# Patient Record
Sex: Male | Born: 1965 | Race: White | Hispanic: No | Marital: Married | Smoking: Never smoker
Health system: Southern US, Community
[De-identification: ages and names within clinical notes are randomized; demographics above are authoritative.]

## PROBLEM LIST (undated history)

## (undated) DIAGNOSIS — Z1379 Encounter for other screening for genetic and chromosomal anomalies: Principal | ICD-10-CM

## (undated) DIAGNOSIS — Z87442 Personal history of urinary calculi: Secondary | ICD-10-CM

## (undated) DIAGNOSIS — C642 Malignant neoplasm of left kidney, except renal pelvis: Secondary | ICD-10-CM

## (undated) DIAGNOSIS — N2889 Other specified disorders of kidney and ureter: Secondary | ICD-10-CM

## (undated) HISTORY — DX: Malignant neoplasm of left kidney, except renal pelvis: C64.2

## (undated) HISTORY — PX: NO PAST SURGERIES: SHX2092

## (undated) HISTORY — PX: OTHER SURGICAL HISTORY: SHX169

## (undated) HISTORY — DX: Encounter for other screening for genetic and chromosomal anomalies: Z13.79

---

## 2014-04-01 ENCOUNTER — Emergency Department (HOSPITAL_BASED_OUTPATIENT_CLINIC_OR_DEPARTMENT_OTHER): Payer: BC Managed Care – PPO

## 2014-04-01 ENCOUNTER — Encounter (HOSPITAL_BASED_OUTPATIENT_CLINIC_OR_DEPARTMENT_OTHER): Payer: Self-pay | Admitting: Emergency Medicine

## 2014-04-01 ENCOUNTER — Emergency Department (HOSPITAL_BASED_OUTPATIENT_CLINIC_OR_DEPARTMENT_OTHER)
Admission: EM | Admit: 2014-04-01 | Discharge: 2014-04-01 | Disposition: A | Discharge status: Home / Self Care | Payer: BC Managed Care – PPO | Attending: Emergency Medicine | Admitting: Emergency Medicine

## 2014-04-01 DIAGNOSIS — M79609 Pain in unspecified limb: Secondary | ICD-10-CM | POA: Insufficient documentation

## 2014-04-01 DIAGNOSIS — M79662 Pain in left lower leg: Secondary | ICD-10-CM

## 2014-04-01 DIAGNOSIS — M7989 Other specified soft tissue disorders: Secondary | ICD-10-CM | POA: Insufficient documentation

## 2014-04-01 LAB — PROTIME-INR
INR: 1.06 (ref 0.00–1.49)
PROTHROMBIN TIME: 13.8 s (ref 11.6–15.2)

## 2014-04-01 LAB — CBC
HCT: 41.7 % (ref 39.0–52.0)
Hemoglobin: 13.8 g/dL (ref 13.0–17.0)
MCH: 28.9 pg (ref 26.0–34.0)
MCHC: 33.1 g/dL (ref 30.0–36.0)
MCV: 87.4 fL (ref 78.0–100.0)
PLATELETS: 211 10*3/uL (ref 150–400)
RBC: 4.77 MIL/uL (ref 4.22–5.81)
RDW: 13.3 % (ref 11.5–15.5)
WBC: 7.2 10*3/uL (ref 4.0–10.5)

## 2014-04-01 LAB — D-DIMER, QUANTITATIVE (NOT AT ARMC): D DIMER QUANT: 0.72 ug{FEU}/mL — AB (ref 0.00–0.48)

## 2014-04-01 MED ORDER — NAPROXEN 500 MG PO TABS: 500.0000 mg | ORAL_TABLET | Freq: Two times a day (BID) | ORAL | Status: DC

## 2014-04-01 NOTE — ED Provider Notes (Signed)
Medical screening examination/treatment/procedure(s) were conducted as a shared visit with non-physician practitioner(s) and myself.  I personally evaluated the patient during the encounter.   Patient seen by me. Patient with complaint of left leg swelling no significant ankle swelling left leg is definitely bigger clinically than the right. Patient feels as if he has a pulled muscle in that area. No history of distinct injury. However [place] a lot of bike riding. No problem with the right leg. Patient has no shortness of breath no chest pain. D-dimer was elevated so Doppler study of the leg was done it is negative no evidence of any DVT. We'll treat for an inflammatory musculoskeletal type swelling of Naprosyn. Patient will return for any newer worse symptoms.   EKG Interpretation None     .   EKG Interpretation None      Results for orders placed during the hospital encounter of 04/01/14  PROTIME-INR      Result Value Ref Range   Prothrombin Time 13.8  11.6 - 15.2 seconds   INR 1.06  0.00 - 1.49  CBC      Result Value Ref Range   WBC 7.2  4.0 - 10.5 K/uL   RBC 4.77  4.22 - 5.81 MIL/uL   Hemoglobin 13.8  13.0 - 17.0 g/dL   HCT 41.7  39.0 - 52.0 %   MCV 87.4  78.0 - 100.0 fL   MCH 28.9  26.0 - 34.0 pg   MCHC 33.1  30.0 - 36.0 g/dL   RDW 13.3  11.5 - 15.5 %   Platelets 211  150 - 400 K/uL  D-DIMER, QUANTITATIVE      Result Value Ref Range   D-Dimer, Quant 0.72 (*) 0.00 - 0.48 ug/mL-FEU   Results for orders placed during the hospital encounter of 04/01/14  PROTIME-INR      Result Value Ref Range   Prothrombin Time 13.8  11.6 - 15.2 seconds   INR 1.06  0.00 - 1.49  CBC      Result Value Ref Range   WBC 7.2  4.0 - 10.5 K/uL   RBC 4.77  4.22 - 5.81 MIL/uL   Hemoglobin 13.8  13.0 - 17.0 g/dL   HCT 41.7  39.0 - 52.0 %   MCV 87.4  78.0 - 100.0 fL   MCH 28.9  26.0 - 34.0 pg   MCHC 33.1  30.0 - 36.0 g/dL   RDW 13.3  11.5 - 15.5 %    Platelets 211  150 - 400 K/uL  D-DIMER, QUANTITATIVE      Result Value Ref Range   D-Dimer, Quant 0.72 (*) 0.00 - 0.48 ug/mL-FEU   US Venous Img Lower Unilateral Left  04/01/2014   CLINICAL DATA:  Pain and swelling in the left calf. History of varicose veins.  EXAM: Left LOWER EXTREMITY VENOUS DOPPLER ULTRASOUND  TECHNIQUE: Gray-scale sonography with graded compression, as well as color Doppler and duplex ultrasound were performed to evaluate the lower extremity deep venous systems from the level of the common femoral vein and including the common femoral, femoral, profunda femoral, popliteal and calf veins including the posterior tibial, peroneal and gastrocnemius veins when visible. The superficial great saphenous vein was also interrogated. Spectral Doppler was utilized to evaluate flow at rest and with distal augmentation maneuvers in the common femoral, femoral and popliteal veins.  COMPARISON:  None.  FINDINGS: Common Femoral Vein: No evidence of thrombus. Normal compressibility, respiratory phasicity  and response to augmentation.  Saphenofemoral Junction: No evidence of thrombus. Normal compressibility and flow on color Doppler imaging.  Profunda Femoral Vein: No evidence of thrombus. Normal compressibility and flow on color Doppler imaging.  Femoral Vein: No evidence of thrombus. Normal compressibility, respiratory phasicity and response to augmentation.  Popliteal Vein: No evidence of thrombus. Normal compressibility, respiratory phasicity and response to augmentation.  Calf Veins: No evidence of thrombus. Normal compressibility and flow on color Doppler imaging.  Superficial Great Saphenous Vein: No evidence of thrombus. Normal compressibility and flow on color Doppler imaging.  Venous Reflux:  None.  Other Findings: Subcutaneous edema demonstrated in the medial left ankle.  IMPRESSION: No evidence of deep venous thrombosis.   Electronically Signed   By: Lucienne Capers M.D.   On: 04/01/2014  22:33           Fredia Sorrow, MD 04/01/14 [phone]

## 2014-04-01 NOTE — ED Notes (Signed)
Pt. Reports he has been on a vacation driving in a car multiple times.

## 2014-04-01 NOTE — Discharge Instructions (Signed)
Please follow the instructions provided.  You may take the anti-inflammatory medicine as prescribed.  Please follow-up with your primary care provider to continue to follow this swelling.    Return to the nearest ED immediately if you develop chest pain, shortness of breath, dizziness or fainting, new color change or weakness/numbness to your affected leg(s), bleeding, severe headache, confusion or altered mental status, or other concerns.

## 2014-04-01 NOTE — ED Notes (Signed)
Pt. Reports he started having the feeling of a pulled muscle in the Lower L leg and today he noticed he has edema in the L calf.  Pt. Reports pressure pain with walking but other wise no pain.

## 2014-04-01 NOTE — ED Provider Notes (Signed)
CSN: [id number]     Arrival date & time 04/01/14  1929 History   First MD Initiated Contact with Patient 04/01/14 2028     Chief Complaint  Patient presents with  . Leg Pain   HPI Devon Johnson is a 49 yo male with no PMH, presnts to ED with c/o left lower leg swelling x 2 days.  Pt reports he had taken several long drives (5-6 hours each way) 4 days ago.  Then two days ago, he noticed his left calf was larger than the right.  He reports pain only on palpation, describes as achy and rates as 2/10.  He did take an aspirin when when it initially occurred with no change.  He does think it may look less swollen today than yesterday, but not significantly.  He denies redness, heat or pain at rest.  He denies fever, chills, altered sensation, chest pain, or shortness of breath.    History reviewed. No pertinent past medical history. History reviewed. No pertinent past surgical history. No family history on file. History  Substance Use Topics  . Smoking status: Never Smoker   . Smokeless tobacco: Not on file  . Alcohol Use: Not on file    Review of Systems  Constitutional: Negative for chills.  HENT: Negative for sore throat.   Eyes: Negative for visual disturbance.  Respiratory: Negative for cough and shortness of breath.   Cardiovascular: Negative for chest pain and leg swelling.  Gastrointestinal: Negative for nausea, vomiting and diarrhea.  Genitourinary: Negative for dysuria.  Musculoskeletal: Negative for myalgias.  Skin: Negative for rash.  Neurological: Negative for weakness, numbness and headaches.    Allergies  Review of patient's allergies indicates not on file.  Home Medications    BP 145/93  Pulse 83  Resp 18  Ht 6' (1.829 m)  Wt 280 lb (127.007 kg)  BMI 37.97 kg/m2  SpO2 100% Physical Exam  Nursing note and vitals reviewed. Constitutional: He appears well-developed and well-nourished. No distress.  HENT:  Head: Normocephalic and atraumatic.  Mouth/Throat:  Oropharynx is clear and moist.  Eyes: Conjunctivae are normal. Pupils are equal, round, and reactive to light. No scleral icterus.  Neck: Normal range of motion. Neck supple.  Cardiovascular: Normal rate, regular rhythm and intact distal pulses.   Pulses:      Radial pulses are 2+ on the right side, and 2+ on the left side.       Dorsalis pedis pulses are 2+ on the right side, and 2+ on the left side.       Posterior tibial pulses are 2+ on the right side, and 2+ on the left side.  Pulmonary/Chest: Effort normal and breath sounds normal. Not tachypneic. No respiratory distress. He has no wheezes. He has no rales. He exhibits no tenderness.  Abdominal: Soft. There is no tenderness.  Musculoskeletal:       Left lower leg: He exhibits tenderness and swelling. He exhibits no deformity.  Left calf is obviously larger than rt calf on exam.  No redness, heat or pain.  Lymphadenopathy:    He has no cervical adenopathy.  Neurological: He is alert.  Skin: Skin is warm, dry and intact. No bruising, no ecchymosis and no rash noted. He is not diaphoretic. No erythema.  Psychiatric: He has a normal mood and affect.    ED Course  Procedures (including critical care time) Labs Review Labs Reviewed  D-DIMER, QUANTITATIVE - Abnormal; Notable for the following:    D-Dimer, Quant 0.72 (*)  All other components within normal limits  PROTIME-INR  CBC    Imaging Review US Venous Img Lower Unilateral Left  04/01/2014   CLINICAL DATA:  Pain and swelling in the left calf. History of varicose veins.  EXAM: Left LOWER EXTREMITY VENOUS DOPPLER ULTRASOUND  TECHNIQUE: Gray-scale sonography with graded compression, as well as color Doppler and duplex ultrasound were performed to evaluate the lower extremity deep venous systems from the level of the common femoral vein and including the common femoral, femoral, profunda femoral, popliteal and calf veins including the posterior tibial, peroneal and gastrocnemius  veins when visible. The superficial great saphenous vein was also interrogated. Spectral Doppler was utilized to evaluate flow at rest and with distal augmentation maneuvers in the common femoral, femoral and popliteal veins.  COMPARISON:  None.  FINDINGS: Common Femoral Vein: No evidence of thrombus. Normal compressibility, respiratory phasicity and response to augmentation.  Saphenofemoral Junction: No evidence of thrombus. Normal compressibility and flow on color Doppler imaging.  Profunda Femoral Vein: No evidence of thrombus. Normal compressibility and flow on color Doppler imaging.  Femoral Vein: No evidence of thrombus. Normal compressibility, respiratory phasicity and response to augmentation.  Popliteal Vein: No evidence of thrombus. Normal compressibility, respiratory phasicity and response to augmentation.  Calf Veins: No evidence of thrombus. Normal compressibility and flow on color Doppler imaging.  Superficial Great Saphenous Vein: No evidence of thrombus. Normal compressibility and flow on color Doppler imaging.  Venous Reflux:  None.  Other Findings: Subcutaneous edema demonstrated in the medial left ankle.  IMPRESSION: No evidence of deep venous thrombosis.   Electronically Signed   By: Lucienne Capers M.D.   On: 04/01/2014 22:33     EKG Interpretation None      MDM   Final diagnoses:  Left leg swelling   Pt is 48 yo male presents with left calf swelling x 2 days.  Pt is well appearing, with no chest pain or shortness of breath, 2+ pulses distally.  Ordered Lower extremity US, CBC, PT-INR and D-Dimer.  D-dimer elevated at 0.72.  [place] results: no DVT.   Pt remains pain free in the ED, without c/o chest pain or shortness of breath.  Doubt current acute process including PE.  Discharge instructions include follow-up in PCP and naprosyn.  Pt is agreeable with plan. Return precautions given.    Meds given in ED:  Medications - No data to display  New Prescriptions   NAPROXEN (NAPROSYN)  500 MG TABLET    Take 1 tablet (500 mg total) by mouth 2 (two) times daily.      Britt Bottom, NP 04/01/14 [phone]

## 2017-04-21 ENCOUNTER — Emergency Department (HOSPITAL_BASED_OUTPATIENT_CLINIC_OR_DEPARTMENT_OTHER)
Admission: EM | Admit: 2017-04-21 | Discharge: 2017-04-21 | Disposition: A | Discharge status: Home / Self Care | Payer: BC Managed Care – PPO | Attending: Emergency Medicine | Admitting: Emergency Medicine

## 2017-04-21 ENCOUNTER — Emergency Department (HOSPITAL_BASED_OUTPATIENT_CLINIC_OR_DEPARTMENT_OTHER): Payer: BC Managed Care – PPO

## 2017-04-21 ENCOUNTER — Other Ambulatory Visit (HOSPITAL_BASED_OUTPATIENT_CLINIC_OR_DEPARTMENT_OTHER): Payer: Self-pay | Admitting: Internal Medicine

## 2017-04-21 ENCOUNTER — Encounter (HOSPITAL_BASED_OUTPATIENT_CLINIC_OR_DEPARTMENT_OTHER): Payer: Self-pay | Admitting: Emergency Medicine

## 2017-04-21 DIAGNOSIS — N2889 Other specified disorders of kidney and ureter: Secondary | ICD-10-CM | POA: Diagnosis not present

## 2017-04-21 DIAGNOSIS — R319 Hematuria, unspecified: Secondary | ICD-10-CM | POA: Diagnosis not present

## 2017-04-21 DIAGNOSIS — R109 Unspecified abdominal pain: Secondary | ICD-10-CM

## 2017-04-21 DIAGNOSIS — R1032 Left lower quadrant pain: Secondary | ICD-10-CM | POA: Diagnosis present

## 2017-04-21 DIAGNOSIS — Z79899 Other long term (current) drug therapy: Secondary | ICD-10-CM | POA: Insufficient documentation

## 2017-04-21 LAB — URINALYSIS, ROUTINE W REFLEX MICROSCOPIC
Bilirubin Urine: NEGATIVE
Glucose, UA: 100 mg/dL — AB
Ketones, ur: NEGATIVE mg/dL
NITRITE: POSITIVE — AB
PH: 7.5 (ref 5.0–8.0)
Protein, ur: 30 mg/dL — AB
SPECIFIC GRAVITY, URINE: 1.015 (ref 1.005–1.030)

## 2017-04-21 LAB — CBC WITH DIFFERENTIAL/PLATELET
Basophils Absolute: 0 10*3/uL (ref 0.0–0.1)
Basophils Relative: 0 %
Eosinophils Absolute: 0 10*3/uL (ref 0.0–0.7)
Eosinophils Relative: 0 %
HCT: 44.2 % (ref 39.0–52.0)
HEMOGLOBIN: 15 g/dL (ref 13.0–17.0)
LYMPHS ABS: 1.7 10*3/uL (ref 0.7–4.0)
Lymphocytes Relative: 14 %
MCH: 28.8 pg (ref 26.0–34.0)
MCHC: 33.9 g/dL (ref 30.0–36.0)
MCV: 84.8 fL (ref 78.0–100.0)
Monocytes Absolute: 0.6 10*3/uL (ref 0.1–1.0)
Monocytes Relative: 5 %
NEUTROS PCT: 81 %
Neutro Abs: 9.6 10*3/uL — ABNORMAL HIGH (ref 1.7–7.7)
Platelets: 234 10*3/uL (ref 150–400)
RBC: 5.21 MIL/uL (ref 4.22–5.81)
RDW: 13.3 % (ref 11.5–15.5)
WBC: 12.1 10*3/uL — ABNORMAL HIGH (ref 4.0–10.5)

## 2017-04-21 LAB — URINALYSIS, MICROSCOPIC (REFLEX)
BACTERIA UA: NONE SEEN
Squamous Epithelial / LPF: NONE SEEN

## 2017-04-21 LAB — COMPREHENSIVE METABOLIC PANEL
ALK PHOS: 44 U/L (ref 38–126)
ALT: 19 U/L (ref 17–63)
ANION GAP: 7 (ref 5–15)
AST: 20 U/L (ref 15–41)
Albumin: 4.4 g/dL (ref 3.5–5.0)
BUN: 24 mg/dL — ABNORMAL HIGH (ref 6–20)
CO2: 29 mmol/L (ref 22–32)
CREATININE: 1.35 mg/dL — AB (ref 0.61–1.24)
Calcium: 9.3 mg/dL (ref 8.9–10.3)
Chloride: 104 mmol/L (ref 101–111)
GFR calc Af Amer: >60 mL/min (ref >60)
GFR calc non Af Amer: 59 mL/min — ABNORMAL LOW (ref >60)
Glucose, Bld: 148 mg/dL — ABNORMAL HIGH (ref 65–99)
Potassium: 3.8 mmol/L (ref 3.5–5.1)
Sodium: 140 mmol/L (ref 135–145)
Total Bilirubin: 0.7 mg/dL (ref 0.3–1.2)
Total Protein: 7.3 g/dL (ref 6.5–8.1)

## 2017-04-21 MED ORDER — MORPHINE SULFATE (PF) 4 MG/ML IV SOLN
4.0000 mg | Freq: Once | INTRAVENOUS | Status: AC
  Administered 2017-04-21: 4 mg via INTRAVENOUS
  Filled 2017-04-21: qty 1

## 2017-04-21 MED ORDER — HYDROMORPHONE HCL 1 MG/ML IJ SOLN
1.0000 mg | Freq: Once | INTRAMUSCULAR | Status: AC
  Administered 2017-04-21: 1 mg via INTRAVENOUS
  Filled 2017-04-21: qty 1

## 2017-04-21 MED ORDER — ONDANSETRON HCL 4 MG/2ML IJ SOLN
4.0000 mg | Freq: Once | INTRAMUSCULAR | Status: AC
  Administered 2017-04-21: 4 mg via INTRAVENOUS
  Filled 2017-04-21: qty 2

## 2017-04-21 MED ORDER — ONDANSETRON 8 MG PO TBDP: 8.0000 mg | ORAL_TABLET | Freq: Three times a day (TID) | ORAL | 0 refills | Status: DC | PRN

## 2017-04-21 MED ORDER — SODIUM CHLORIDE 0.9 % IV BOLUS (SEPSIS)
1000.0000 mL | Freq: Once | INTRAVENOUS | Status: AC
Start: 2017-04-21 — End: 2017-04-21
  Administered 2017-04-21: 1000 mL via INTRAVENOUS

## 2017-04-21 MED ORDER — ONDANSETRON HCL 4 MG/2ML IJ SOLN
INTRAMUSCULAR | Status: AC
  Administered 2017-04-21: 4 mg
  Filled 2017-04-21: qty 2

## 2017-04-21 MED ORDER — KETOROLAC TROMETHAMINE 30 MG/ML IJ SOLN
30.0000 mg | Freq: Once | INTRAMUSCULAR | Status: AC
  Administered 2017-04-21: 30 mg via INTRAVENOUS
  Filled 2017-04-21: qty 1

## 2017-04-21 MED ORDER — OXYCODONE-ACETAMINOPHEN 5-325 MG PO TABS: 1.0000 | ORAL_TABLET | Freq: Four times a day (QID) | ORAL | 0 refills | Status: DC | PRN

## 2017-04-21 MED FILL — ONDANSETRON ODT 8 MG TABLET: 8 | 3 days supply | Qty: 10 | Fill #0

## 2017-04-21 MED FILL — OXYCODONE-ACETAMINOPHEN 5-3: 5-325 | 2 days supply | Qty: 20 | Fill #0

## 2017-04-21 NOTE — Discharge Instructions (Signed)
It was our pleasure to provide your ER care today.  As we discussed, today's CT scan shows a 12 cm left-sided kidney mass.   The urologist we spoke to indicates they will contact you with appointment today.  If you have not heard from their office by early this afternoon, contact the office to arrange appointment time.    Take motrin or aleve as need for pain. You may also take percocet as need for pain. No driving for the next 6 hours or when taking percocet. Also, do not take tylenol or acetaminophen containing medication when taking percocet.   You may take zofran as need for nausea.  Return to ER if worse, intractable pain, fevers, persistent vomiting, other concern.

## 2017-04-21 NOTE — ED Provider Notes (Signed)
Signed out to check CT.  CT results discussed w pt.  Pts father had renal cell cancer in his 67's.   Discussed w Dr Karsten Ro - he will have office call pt with appointment.     Lajean Saver, MD 04/21/17 [phone]

## 2017-04-21 NOTE — ED Provider Notes (Signed)
TIME SEEN: 6:44 AM  CHIEF COMPLAINT: Left flank pain, hematuria  HPI: Patient is a 51 year old male with no significant past medical history who presents emergency department with sudden onset left flank pain that woke him from sleep.   He reports he has had some hematuria for the past several days and has been seen by his primary care doctor for this. No dysuria. Tonight he had nausea, vomiting, cold sweats. No fever. He has never had a kidney stone before. No abdominal pain. No chest pain or shortness of breath.   ROS: See HPI Constitutional: no fever  Eyes: no drainage  ENT: no runny nose   Cardiovascular:  no chest pain  Resp: no SOB  GI: no vomiting GU: no dysuria; + Hematuria Integumentary: no rash  Allergy: no hives  Musculoskeletal: no leg swelling  Neurological: no slurred speech ROS otherwise negative  PAST MEDICAL HISTORY/PAST SURGICAL HISTORY:  History reviewed. No pertinent past medical history.  MEDICATIONS:  Prior to Admission medications   Medication Sig Start Date End Date Taking? Authorizing Provider  pyridoxine (B-6) 200 MG tablet Take 200 mg by mouth daily.   Yes [provider]  naproxen (NAPROSYN) 500 MG tablet Take 1 tablet (500 mg total) by mouth 2 (two) times daily. 04/01/14   Britt Bottom, NP    ALLERGIES:  No Known Allergies  SOCIAL HISTORY:  Social History  Substance Use Topics  . Smoking status: Never Smoker  . Smokeless tobacco: Never Used  . Alcohol use Yes    FAMILY HISTORY: No family history on file.  EXAM: BP (!) 149/81 (BP Location: Left Arm)   Pulse 64   Temp 98.1 F (36.7 C) (Oral)   Resp 18   Ht 6' (1.829 m)   Wt 127 kg (280 lb)   SpO2 99%   BMI 37.97 kg/m  CONSTITUTIONAL: Alert and oriented and responds appropriately to questions. Obese, appears uncomfortable, afebrile, nontoxic, well-hydrated HEAD: Normocephalic EYES: Conjunctivae clear, pupils appear equal, EOMI ENT: normal nose; moist mucous  membranes NECK: Supple, no meningismus, no nuchal rigidity, no LAD  CARD: RRR; S1 and S2 appreciated; no murmurs, no clicks, no rubs, no gallops RESP: Normal chest excursion without splinting or tachypnea; breath sounds clear and equal bilaterally; no wheezes, no rhonchi, no rales, no hypoxia or respiratory distress, speaking full sentences ABD/GI: Normal bowel sounds; non-distended; soft, non-tender, no rebound, no guarding, no peritoneal signs, no hepatosplenomegaly BACK:  The back appears normal and is non-tender to palpation, there is no CVA tenderness, no midline spinal tenderness or step-off or deformity EXT: Normal ROM in all joints; non-tender to palpation; no edema; normal capillary refill; no cyanosis, no calf tenderness or swelling    SKIN: Normal color for age and race; warm; no rash NEURO: Moves all extremities equally, normal gait, sensation to light touch intact diffusely, normal speech PSYCH: The patient's mood and manner are appropriate. Grooming and personal hygiene are appropriate.  MEDICAL DECISION MAKING: Patient here with left flank pain and hematuria. Suspect kidney stone. We'll obtain labs, urinalysis and CT of the abdomen and pelvis. We'll give IV fluids, Toradol, Zofran, morphine and reassess. His wife is here at the bedside to drive him home.  ED PROGRESS: Signed out to Dr. Ashok Cordia who will follow-up on patient's labs, urine and imaging.  I reviewed all nursing notes, vitals, pertinent previous records, EKGs, lab and urine results, imaging (as available).      Desarae Placide, Delice Bison, DO 04/21/17 [phone]

## 2017-04-21 NOTE — ED Triage Notes (Signed)
Pt c/o left flank pain starting last night. Pt reports hematuria earlier int he week and saw PMD for this.

## 2017-04-22 LAB — URINE CULTURE: Culture: NO GROWTH

## 2017-04-24 ENCOUNTER — Ambulatory Visit (HOSPITAL_COMMUNITY)
Admission: RE | Admit: 2017-04-24 | Discharge: 2017-04-24 | Disposition: A | Discharge status: Home / Self Care | Payer: BC Managed Care – PPO | Source: Ambulatory Visit | Attending: Emergency Medicine | Admitting: Emergency Medicine

## 2017-04-24 ENCOUNTER — Other Ambulatory Visit: Payer: Self-pay | Admitting: Emergency Medicine

## 2017-04-24 ENCOUNTER — Other Ambulatory Visit (HOSPITAL_COMMUNITY): Payer: Self-pay | Admitting: Urology

## 2017-04-24 DIAGNOSIS — C642 Malignant neoplasm of left kidney, except renal pelvis: Secondary | ICD-10-CM | POA: Diagnosis not present

## 2017-04-24 DIAGNOSIS — D49512 Neoplasm of unspecified behavior of left kidney: Secondary | ICD-10-CM

## 2017-04-28 ENCOUNTER — Ambulatory Visit (HOSPITAL_COMMUNITY)
Admission: RE | Admit: 2017-04-28 | Discharge: 2017-04-28 | Disposition: A | Discharge status: Home / Self Care | Payer: BC Managed Care – PPO | Source: Ambulatory Visit | Attending: Urology | Admitting: Urology

## 2017-04-28 DIAGNOSIS — D49512 Neoplasm of unspecified behavior of left kidney: Secondary | ICD-10-CM

## 2017-04-28 DIAGNOSIS — K7689 Other specified diseases of liver: Secondary | ICD-10-CM | POA: Diagnosis not present

## 2017-04-28 MED ORDER — GADOBENATE DIMEGLUMINE 529 MG/ML IV SOLN
20.0000 mL | Freq: Once | INTRAVENOUS | Status: AC | PRN
Start: 2017-04-28 — End: 2017-04-28
  Administered 2017-04-28: 20 mL via INTRAVENOUS

## 2017-05-01 ENCOUNTER — Other Ambulatory Visit: Payer: Self-pay | Admitting: Urology

## 2017-05-10 NOTE — Patient Instructions (Signed)
Devon Johnson  05/10/2017   Your procedure is scheduled on: 05/16/17  Report to [facility] Main  Entrance Take [place]  elevators to 3rd floor to  [place] at    0900 AM.    Call this number if you have problems the morning of surgery [phone]    Remember: ONLY 1 PERSON MAY GO WITH YOU TO SHORT STAY TO GET  READY MORNING OF YOUR SURGERY.  Do not eat food or drink liquids :After Midnight.     Take these medicines the morning of surgery with A SIP OF WATER: none                                You may not have any metal on your body including hair pins and              piercings  Do not wear jewelry,  lotions, powders or perfumes, deodorant                Men may shave face and neck.   Do not bring valuables to the hospital. [place].  Contacts, dentures or bridgework may not be worn into surgery.  Leave suitcase in the car. After surgery it may be brought to your room.           [place] - Preparing for Surgery Before surgery, you can play an important role.  Because skin is not sterile, your skin needs to be as free of germs as possible.  You can reduce the number of germs on your skin by washing with CHG (chlorahexidine gluconate) soap before surgery.  CHG is an antiseptic cleaner which kills germs and bonds with the skin to continue killing germs even after washing. Please DO NOT use if you have an allergy to CHG or antibacterial soaps.  If your skin becomes reddened/irritated stop using the CHG and inform your nurse when you arrive at Short Stay. Do not shave (including legs and underarms) for at least 48 hours prior to the first CHG shower.  You may shave your face/neck. Please follow these instructions carefully:  1.  Shower with CHG Soap the night before surgery and the  morning of Surgery.  2.  If you choose to wash your hair, wash your hair first as usual with your  normal  shampoo.  3.   After you shampoo, rinse your hair and body thoroughly to remove the  shampoo.                           4.  Use CHG as you would any other liquid soap.  You can apply chg directly  to the skin and wash                       Gently with a scrungie or clean washcloth.  5.  Apply the CHG Soap to your body ONLY FROM THE NECK DOWN.   Do not use on face/ open                           Wound or open sores. Avoid contact with eyes,  ears mouth and genitals (private parts).                       Wash face,  Genitals (private parts) with your normal soap.             6.  Wash thoroughly, paying special attention to the area where your surgery  will be performed.  7.  Thoroughly rinse your body with warm water from the neck down.  8.  DO NOT shower/wash with your normal soap after using and rinsing off  the CHG Soap.                9.  Pat yourself dry with a clean towel.            10.  Wear clean pajamas.            11.  Place clean sheets on your bed the night of your first shower and do not  sleep with pets. Day of Surgery : Do not apply any lotions/deodorants the morning of surgery.  Please wear clean clothes to the hospital/surgery center.  FAILURE TO FOLLOW THESE INSTRUCTIONS MAY RESULT IN THE CANCELLATION OF YOUR SURGERY PATIENT SIGNATURE_________________________________  NURSE SIGNATURE__________________________________  ________________________________________________________________________    CLEAR LIQUID DIET  THE DAY BEFORE YOUR SURGERY  NOTHING AFTER MIDNIGHT   Foods Allowed                                                                     Foods Excluded  Coffee and tea, regular and decaf                             liquids that you cannot  Plain Jell-O in any flavor                                             see through such as: Fruit ices (not with fruit pulp)                                     milk, soups, orange juice  Iced Popsicles                                    All  solid food Carbonated beverages, regular and diet                                    Cranberry, grape and apple juices Sports drinks like Gatorade Lightly seasoned clear broth or consume(fat free) Sugar, honey syrup  Sample Menu Breakfast                                Lunch  Supper Cranberry juice                    Beef broth                            Chicken broth Jell-O                                     Grape juice                           Apple juice Coffee or tea                        Jell-O                                      Popsicle                                                Coffee or tea                        Coffee or tea  _____________________________________________________________________   WHAT IS A BLOOD TRANSFUSION? Blood Transfusion Information  A transfusion is the replacement of blood or some of its parts. Blood is made up of multiple cells which provide different functions.  Red blood cells carry oxygen and are used for blood loss replacement.  White blood cells fight against infection.  Platelets control bleeding.  Plasma helps clot blood.  Other blood products are available for specialized needs, such as hemophilia or other clotting disorders. BEFORE THE TRANSFUSION  Who gives blood for transfusions?   Healthy volunteers who are fully evaluated to make sure their blood is safe. This is blood bank blood. Transfusion therapy is the safest it has ever been in the practice of medicine. Before blood is taken from a donor, a complete history is taken to make sure that person has no history of diseases nor engages in risky social behavior (examples are intravenous drug use or sexual activity with multiple partners). The donor's travel history is screened to minimize risk of transmitting infections, such as malaria. The donated blood is tested for signs of infectious diseases, such as HIV and hepatitis. The blood is then  tested to be sure it is compatible with you in order to minimize the chance of a transfusion reaction. If you or a relative donates blood, this is often done in anticipation of surgery and is not appropriate for emergency situations. It takes many days to process the donated blood. RISKS AND COMPLICATIONS Although transfusion therapy is very safe and saves many lives, the main dangers of transfusion include:   Getting an infectious disease.  Developing a transfusion reaction. This is an allergic reaction to something in the blood you were given. Every precaution is taken to prevent this. The decision to have a blood transfusion has been considered carefully by your caregiver before blood is given. Blood is not given unless the benefits outweigh the risks. AFTER THE TRANSFUSION  Right after receiving a blood transfusion, you will usually  feel much better and more energetic. This is especially true if your red blood cells have gotten low (anemic). The transfusion raises the level of the red blood cells which carry oxygen, and this usually causes an energy increase.  The nurse administering the transfusion will monitor you carefully for complications. HOME CARE INSTRUCTIONS  No special instructions are needed after a transfusion. You may find your energy is better. Speak with your caregiver about any limitations on activity for underlying diseases you may have. SEEK MEDICAL CARE IF:   Your condition is not improving after your transfusion.  You develop redness or irritation at the intravenous (IV) site. SEEK IMMEDIATE MEDICAL CARE IF:  Any of the following symptoms occur over the next 12 hours:  Shaking chills.  You have a temperature by mouth above 102 F (38.9 C), not controlled by medicine.  Chest, back, or muscle pain.  People around you feel you are not acting correctly or are confused.  Shortness of breath or difficulty breathing.  Dizziness and fainting.  You get a rash or  develop hives.  You have a decrease in urine output.  Your urine turns a dark color or changes to pink, red, or brown. Any of the following symptoms occur over the next 10 days:  You have a temperature by mouth above 102 F (38.9 C), not controlled by medicine.  Shortness of breath.  Weakness after normal activity.  The white part of the eye turns yellow (jaundice).  You have a decrease in the amount of urine or are urinating less often.  Your urine turns a dark color or changes to pink, red, or brown. Document Released: 07/22/2000 Document Revised: 10/17/2011 Document Reviewed: 03/10/2008 [facility] Patient Information 2014 [place], [place].  _______________________________________________________________________

## 2017-05-11 ENCOUNTER — Encounter (HOSPITAL_COMMUNITY): Payer: Self-pay

## 2017-05-11 ENCOUNTER — Encounter (HOSPITAL_COMMUNITY)
Admission: RE | Admit: 2017-05-11 | Discharge: 2017-05-11 | Disposition: A | Discharge status: Home / Self Care | Payer: BC Managed Care – PPO | Source: Ambulatory Visit | Attending: Urology | Admitting: Urology

## 2017-05-11 DIAGNOSIS — Z01812 Encounter for preprocedural laboratory examination: Secondary | ICD-10-CM | POA: Diagnosis not present

## 2017-05-11 DIAGNOSIS — N2889 Other specified disorders of kidney and ureter: Secondary | ICD-10-CM | POA: Insufficient documentation

## 2017-05-11 HISTORY — DX: Personal history of urinary calculi: Z87.442

## 2017-05-11 HISTORY — DX: Other specified disorders of kidney and ureter: N28.89

## 2017-05-11 LAB — CBC
HCT: 43.6 % (ref 39.0–52.0)
Hemoglobin: 14.6 g/dL (ref 13.0–17.0)
MCH: 27.9 pg (ref 26.0–34.0)
MCHC: 33.5 g/dL (ref 30.0–36.0)
MCV: 83.4 fL (ref 78.0–100.0)
PLATELETS: 236 10*3/uL (ref 150–400)
RBC: 5.23 MIL/uL (ref 4.22–5.81)
RDW: 12.8 % (ref 11.5–15.5)
WBC: 6 10*3/uL (ref 4.0–10.5)

## 2017-05-11 LAB — BASIC METABOLIC PANEL
Anion gap: 9 (ref 5–15)
BUN: 18 mg/dL (ref 6–20)
CALCIUM: 9.4 mg/dL (ref 8.9–10.3)
CO2: 26 mmol/L (ref 22–32)
CREATININE: 0.94 mg/dL (ref 0.61–1.24)
Chloride: 105 mmol/L (ref 101–111)
GFR calc Af Amer: >60 mL/min (ref >60)
GLUCOSE: 103 mg/dL — AB (ref 65–99)
Potassium: 4.1 mmol/L (ref 3.5–5.1)
SODIUM: 140 mmol/L (ref 135–145)

## 2017-05-11 LAB — ABO/RH: ABO/RH(D): A POS

## 2017-05-15 NOTE — H&P (Signed)
Urology Preoperative H&P   Chief Complaint: left renal mass  History of Present Illness: Devon Johnson is a 51 y.o. male who initially presented to the emergency department on 04/21/2017 with worsening left-sided flank pain, nausea/vomiting and gross hematuria.  He had a CT of the abdomen/pelvis without contrast that demonstrated a 12 cm left renal mass with possible extension beyond Gerota's fascia.  He eventually had an MRI of the abdomen with and without contrast that confirmed the solid left renal mass and was negative for signs of tumor thrombus.  His MRI did show possible 1 cm para-aortic lymph node.  Currently, the patient is still having intermittent left-sided flank pain, but his hematuria has improved.  He has a strong family history of renal cell carcinoma involving his father, who passed away in his 37s secondary to metastatic disease.    Past Medical History:  Diagnosis Date  . History of kidney stones   . Left renal mass     Past Surgical History:  Procedure Laterality Date  . NO PAST SURGERIES    . robotic radical nephrectomy     Dr. Lovena Neighbours 05/16/17    Allergies: No Known Allergies  No family history on file.  Social History:  reports that he has never smoked. He has never used smokeless tobacco. He reports that he drinks alcohol. He reports that he does not use drugs.  ROS: A complete review of systems was performed.  All systems are negative except for pertinent findings as noted.  Physical Exam:  Vital signs in last 24 hours:   Constitutional:  Alert and oriented, No acute distress Cardiovascular: Regular rate and rhythm, No JVD Respiratory: Normal respiratory effort, Lungs clear bilaterally GI: Abdomen is soft, nontender, nondistended, no abdominal masses GU: No CVA tenderness Lymphatic: No lymphadenopathy Neurologic: Grossly intact, no focal deficits Psychiatric: Normal mood and affect  Laboratory Data:  No results for input(s): WBC, HGB, HCT, PLT in the  last 72 hours.  No results for input(s): NA, K, CL, GLUCOSE, BUN, CALCIUM, CREATININE in the last 72 hours.  Invalid input(s): CO3   No results found for this or any previous visit (from the past 24 hour(s)). No results found for this or any previous visit (from the past 240 hour(s)).  Renal Function:  Recent Labs  05/11/17 0838  CREATININE 0.94   Estimated Creatinine Clearance: 124.3 mL/min (by C-G formula based on SCr of 0.94 mg/dL).  Radiologic Imaging: No results found.  I independently reviewed the above imaging studies.  Assessment and Plan [name] is a 51 y.o. male with a 12 cm sold left renal mass with features concerning for malignancy  I reviewed imaging results and films with the patient personally.  Discussed that the mass in question has features concerning for malignancy.  Explained the natural history of presumed renal cell carcinoma.  Reviewed the AUA guidelines for evaluation and treatment of renal masses.  Active surveillance, in situ tumor ablation, partial and radical nephrectomy discussed.  The risks of robotic left radical nephrectomy was discussed in detail including but not limited to:  negative pathology, open conversion, infection of the urinary tract/skin/abdominal cavity, VTE, MI/CVA, lymphatic leak, injury to adjacent solid/hollow viscus organs, bleeding requiring a blood transfusion, catastrophic bleeding, hernia formation, need for postoperative angioembolization, and other imponderables.     Ellison Hughs, MD 05/15/2017, 5:12 PM  [facility] Pager: [phone]

## 2017-05-16 ENCOUNTER — Encounter (HOSPITAL_COMMUNITY): Payer: Self-pay | Admitting: *Deleted

## 2017-05-16 ENCOUNTER — Ambulatory Visit (HOSPITAL_COMMUNITY): Payer: BC Managed Care – PPO | Admitting: Anesthesiology

## 2017-05-16 ENCOUNTER — Inpatient Hospital Stay (HOSPITAL_COMMUNITY)
Admission: AD | Admit: 2017-05-16 | Discharge: 2017-05-18 | DRG: 658 | Disposition: A | Discharge status: Home / Self Care | Payer: BC Managed Care – PPO | Source: Ambulatory Visit | Attending: Urology | Admitting: Urology

## 2017-05-16 ENCOUNTER — Encounter (HOSPITAL_COMMUNITY)
Admission: AD | Disposition: A | Discharge status: Home / Self Care | Payer: Self-pay | Source: Ambulatory Visit | Attending: Urology

## 2017-05-16 DIAGNOSIS — R310 Gross hematuria: Secondary | ICD-10-CM | POA: Diagnosis present

## 2017-05-16 DIAGNOSIS — E669 Obesity, unspecified: Secondary | ICD-10-CM | POA: Diagnosis present

## 2017-05-16 DIAGNOSIS — R109 Unspecified abdominal pain: Secondary | ICD-10-CM | POA: Diagnosis present

## 2017-05-16 DIAGNOSIS — C642 Malignant neoplasm of left kidney, except renal pelvis: Secondary | ICD-10-CM | POA: Diagnosis present

## 2017-05-16 DIAGNOSIS — R112 Nausea with vomiting, unspecified: Secondary | ICD-10-CM | POA: Diagnosis present

## 2017-05-16 DIAGNOSIS — Z6836 Body mass index (BMI) 36.0-36.9, adult: Secondary | ICD-10-CM | POA: Diagnosis not present

## 2017-05-16 DIAGNOSIS — N2889 Other specified disorders of kidney and ureter: Secondary | ICD-10-CM | POA: Diagnosis present

## 2017-05-16 HISTORY — PX: LYMPH NODE DISSECTION: SHX5087

## 2017-05-16 HISTORY — PX: ROBOTIC ASSITED PARTIAL NEPHRECTOMY: SHX6087

## 2017-05-16 LAB — CBC
HCT: 42.4 % (ref 39.0–52.0)
HEMOGLOBIN: 13.9 g/dL (ref 13.0–17.0)
MCH: 28.3 pg (ref 26.0–34.0)
MCHC: 32.8 g/dL (ref 30.0–36.0)
MCV: 86.4 fL (ref 78.0–100.0)
PLATELETS: 226 10*3/uL (ref 150–400)
RBC: 4.91 MIL/uL (ref 4.22–5.81)
RDW: 13.1 % (ref 11.5–15.5)
WBC: 13.9 10*3/uL — AB (ref 4.0–10.5)

## 2017-05-16 LAB — GLUCOSE, CAPILLARY: GLUCOSE-CAPILLARY: 145 mg/dL — AB (ref 65–99)

## 2017-05-16 LAB — TYPE AND SCREEN
ABO/RH(D): A POS
ANTIBODY SCREEN: NEGATIVE

## 2017-05-16 LAB — CREATININE, SERUM
CREATININE: 1.32 mg/dL — AB (ref 0.61–1.24)
GFR calc non Af Amer: >60 mL/min (ref >60)

## 2017-05-16 LAB — HEMOGLOBIN AND HEMATOCRIT, BLOOD
HCT: 42.1 % (ref 39.0–52.0)
Hemoglobin: 14 g/dL (ref 13.0–17.0)

## 2017-05-16 SURGERY — ROBOTIC ASSITED PARTIAL NEPHRECTOMY
Anesthesia: General

## 2017-05-16 MED ORDER — PROMETHAZINE HCL 12.5 MG PO TABS: 12.5000 mg | ORAL_TABLET | ORAL | 0 refills | Status: DC | PRN

## 2017-05-16 MED ORDER — HYDROMORPHONE HCL-NACL 0.5-0.9 MG/ML-% IV SOSY
0.2500 mg | PREFILLED_SYRINGE | INTRAVENOUS | Status: DC | PRN
  Administered 2017-05-16 (×3): 0.5 mg via INTRAVENOUS

## 2017-05-16 MED ORDER — BUPIVACAINE-EPINEPHRINE 0.25% -1:200000 IJ SOLN
INTRAMUSCULAR | Status: AC
  Filled 2017-05-16: qty 1

## 2017-05-16 MED ORDER — BUPIVACAINE-EPINEPHRINE (PF) 0.25% -1:200000 IJ SOLN
INTRAMUSCULAR | Status: DC | PRN
  Administered 2017-05-16: 50 mL via PERINEURAL

## 2017-05-16 MED ORDER — ROCURONIUM BROMIDE 50 MG/5ML IV SOSY
PREFILLED_SYRINGE | INTRAVENOUS | Status: AC
  Filled 2017-05-16: qty 5

## 2017-05-16 MED ORDER — FENTANYL CITRATE (PF) 100 MCG/2ML IJ SOLN
INTRAMUSCULAR | Status: DC | PRN
  Administered 2017-05-16 (×2): 50 ug via INTRAVENOUS
  Administered 2017-05-16: 100 ug via INTRAVENOUS
  Administered 2017-05-16: 50 ug via INTRAVENOUS

## 2017-05-16 MED ORDER — MIDAZOLAM HCL 5 MG/5ML IJ SOLN
INTRAMUSCULAR | Status: DC | PRN
  Administered 2017-05-16: 2 mg via INTRAVENOUS

## 2017-05-16 MED ORDER — HYDROMORPHONE HCL-NACL 0.5-0.9 MG/ML-% IV SOSY
PREFILLED_SYRINGE | INTRAVENOUS | Status: AC
  Administered 2017-05-16: 0.5 mg via INTRAVENOUS
  Filled 2017-05-16: qty 1

## 2017-05-16 MED ORDER — MEPERIDINE HCL 50 MG/ML IJ SOLN
INTRAMUSCULAR | Status: AC
  Administered 2017-05-16: 12.5 mg via INTRAVENOUS
  Filled 2017-05-16: qty 1

## 2017-05-16 MED ORDER — DEXTROSE-NACL 5-0.45 % IV SOLN
INTRAVENOUS | Status: DC
  Administered 2017-05-16: 17:00:00 via INTRAVENOUS

## 2017-05-16 MED ORDER — DEXAMETHASONE SODIUM PHOSPHATE 10 MG/ML IJ SOLN
INTRAMUSCULAR | Status: AC
  Filled 2017-05-16: qty 1

## 2017-05-16 MED ORDER — FENTANYL CITRATE (PF) 250 MCG/5ML IJ SOLN
INTRAMUSCULAR | Status: AC
  Filled 2017-05-16: qty 5

## 2017-05-16 MED ORDER — ONDANSETRON HCL 4 MG/2ML IJ SOLN: 4.0000 mg | INTRAMUSCULAR | Status: DC | PRN

## 2017-05-16 MED ORDER — MIDAZOLAM HCL 2 MG/2ML IJ SOLN
INTRAMUSCULAR | Status: AC
  Filled 2017-05-16: qty 2

## 2017-05-16 MED ORDER — HYDROCODONE-ACETAMINOPHEN 5-325 MG PO TABS: 1.0000 | ORAL_TABLET | Freq: Four times a day (QID) | ORAL | 0 refills | Status: AC | PRN

## 2017-05-16 MED ORDER — DIPHENHYDRAMINE HCL 12.5 MG/5ML PO ELIX: 12.5000 mg | ORAL_SOLUTION | Freq: Four times a day (QID) | ORAL | Status: DC | PRN

## 2017-05-16 MED ORDER — SUGAMMADEX SODIUM 500 MG/5ML IV SOLN
INTRAVENOUS | Status: DC | PRN
  Administered 2017-05-16: 500 mg via INTRAVENOUS

## 2017-05-16 MED ORDER — DEXAMETHASONE SODIUM PHOSPHATE 10 MG/ML IJ SOLN
INTRAMUSCULAR | Status: DC | PRN
  Administered 2017-05-16: 10 mg via INTRAVENOUS

## 2017-05-16 MED ORDER — HYDROCODONE-ACETAMINOPHEN 5-325 MG PO TABS
1.0000 | ORAL_TABLET | ORAL | Status: DC | PRN
  Administered 2017-05-17 – 2017-05-18 (×5): 1 via ORAL
  Administered 2017-05-18: 2 via ORAL
  Filled 2017-05-16: qty 2
  Filled 2017-05-16: qty 1
  Filled 2017-05-16: qty 2
  Filled 2017-05-16 (×3): qty 1

## 2017-05-16 MED ORDER — KETOROLAC TROMETHAMINE 30 MG/ML IJ SOLN: 30.0000 mg | Freq: Once | INTRAMUSCULAR | Status: DC | PRN

## 2017-05-16 MED ORDER — CEFAZOLIN SODIUM-DEXTROSE 2-4 GM/100ML-% IV SOLN
2.0000 g | INTRAVENOUS | Status: AC
  Administered 2017-05-16: 2 g via INTRAVENOUS
  Filled 2017-05-16: qty 100

## 2017-05-16 MED ORDER — HEPARIN SODIUM (PORCINE) 5000 UNIT/ML IJ SOLN
5000.0000 [IU] | Freq: Three times a day (TID) | INTRAMUSCULAR | Status: DC
  Administered 2017-05-17 – 2017-05-18 (×4): 5000 [IU] via SUBCUTANEOUS
  Filled 2017-05-16 (×4): qty 1

## 2017-05-16 MED ORDER — MEPERIDINE HCL 50 MG/ML IJ SOLN
6.2500 mg | INTRAMUSCULAR | Status: DC | PRN
  Administered 2017-05-16: 12.5 mg via INTRAVENOUS

## 2017-05-16 MED ORDER — SUGAMMADEX SODIUM 500 MG/5ML IV SOLN
INTRAVENOUS | Status: AC
  Filled 2017-05-16: qty 5

## 2017-05-16 MED ORDER — ACETAMINOPHEN 325 MG PO TABS: 650.0000 mg | ORAL_TABLET | ORAL | Status: DC | PRN

## 2017-05-16 MED ORDER — MORPHINE SULFATE (PF) 4 MG/ML IV SOLN
2.0000 mg | INTRAVENOUS | Status: DC | PRN
  Administered 2017-05-17: 2 mg via INTRAVENOUS
  Administered 2017-05-17: 4 mg via INTRAVENOUS
  Filled 2017-05-16 (×2): qty 1

## 2017-05-16 MED ORDER — LIDOCAINE HCL (CARDIAC) 20 MG/ML IV SOLN
INTRAVENOUS | Status: DC | PRN
  Administered 2017-05-16: 60 mg via INTRAVENOUS

## 2017-05-16 MED ORDER — ROCURONIUM BROMIDE 100 MG/10ML IV SOLN
INTRAVENOUS | Status: DC | PRN
  Administered 2017-05-16: 50 mg via INTRAVENOUS
  Administered 2017-05-16 (×4): 20 mg via INTRAVENOUS

## 2017-05-16 MED ORDER — PROPOFOL 10 MG/ML IV BOLUS
INTRAVENOUS | Status: DC | PRN
  Administered 2017-05-16: 200 mg via INTRAVENOUS

## 2017-05-16 MED ORDER — DOCUSATE SODIUM 100 MG PO CAPS: 100.0000 mg | ORAL_CAPSULE | Freq: Two times a day (BID) | ORAL | 0 refills | Status: DC

## 2017-05-16 MED ORDER — PROPOFOL 10 MG/ML IV BOLUS
INTRAVENOUS | Status: AC
  Filled 2017-05-16: qty 20

## 2017-05-16 MED ORDER — LACTATED RINGERS IV SOLN
INTRAVENOUS | Status: DC
  Administered 2017-05-16 (×3): via INTRAVENOUS

## 2017-05-16 MED ORDER — ONDANSETRON HCL 4 MG/2ML IJ SOLN
INTRAMUSCULAR | Status: DC | PRN
  Administered 2017-05-16: 4 mg via INTRAVENOUS

## 2017-05-16 MED ORDER — PROMETHAZINE HCL 25 MG/ML IJ SOLN: 6.2500 mg | INTRAMUSCULAR | Status: DC | PRN

## 2017-05-16 MED ORDER — STERILE WATER FOR IRRIGATION IR SOLN
Status: DC | PRN
  Administered 2017-05-16: 1000 mL

## 2017-05-16 MED ORDER — LIDOCAINE 2% (20 MG/ML) 5 ML SYRINGE
INTRAMUSCULAR | Status: AC
Start: 2017-05-16 — End: ?
  Filled 2017-05-16: qty 5

## 2017-05-16 MED ORDER — LACTATED RINGERS IR SOLN
Status: DC | PRN
  Administered 2017-05-16: 1

## 2017-05-16 MED ORDER — DIPHENHYDRAMINE HCL 50 MG/ML IJ SOLN: 12.5000 mg | Freq: Four times a day (QID) | INTRAMUSCULAR | Status: DC | PRN

## 2017-05-16 MED ORDER — HYDROMORPHONE HCL-NACL 0.5-0.9 MG/ML-% IV SOSY
PREFILLED_SYRINGE | INTRAVENOUS | Status: AC
  Administered 2017-05-16: 0.5 mg via INTRAVENOUS
  Filled 2017-05-16: qty 2

## 2017-05-16 MED ORDER — CEFAZOLIN SODIUM-DEXTROSE 1-4 GM/50ML-% IV SOLN
1.0000 g | Freq: Three times a day (TID) | INTRAVENOUS | Status: AC
  Administered 2017-05-16 – 2017-05-17 (×2): 1 g via INTRAVENOUS
  Filled 2017-05-16 (×2): qty 50

## 2017-05-16 MED ORDER — ONDANSETRON HCL 4 MG/2ML IJ SOLN
INTRAMUSCULAR | Status: AC
  Filled 2017-05-16: qty 2

## 2017-05-16 SURGICAL SUPPLY — 66 items
APPLICATOR SURGIFLO ENDO (HEMOSTASIS) IMPLANT
BAG LAPAROSCOPIC 12 15 PORT 16 (BASKET) ×4 IMPLANT
BAG RETRIEVAL 12/15 (BASKET) ×6
BAG RETRIEVAL 12/15MM (BASKET) ×2
CHLORAPREP W/TINT 26ML (MISCELLANEOUS) ×4 IMPLANT
CLIP SUT LAPRA TY ABSORB (SUTURE) ×4 IMPLANT
CLIP VESOLOCK LG 6/CT PURPLE (CLIP) ×4 IMPLANT
CLIP VESOLOCK MED LG 6/CT (CLIP) ×12 IMPLANT
CLIP VESOLOCK XL 6/CT (CLIP) IMPLANT
COVER SURGICAL LIGHT HANDLE (MISCELLANEOUS) ×4 IMPLANT
COVER TIP SHEARS 8 DVNC (MISCELLANEOUS) ×2 IMPLANT
COVER TIP SHEARS 8MM DA VINCI (MISCELLANEOUS) ×2
CUTTER ECHEON FLEX ENDO 45 340 (ENDOMECHANICALS) ×4 IMPLANT
DECANTER SPIKE VIAL GLASS SM (MISCELLANEOUS) ×4 IMPLANT
DERMABOND ADVANCED (GAUZE/BANDAGES/DRESSINGS) ×2
DERMABOND ADVANCED .7 DNX12 (GAUZE/BANDAGES/DRESSINGS) ×2 IMPLANT
DRAIN CHANNEL 15F RND FF 3/16 (WOUND CARE) IMPLANT
DRAPE ARM DVNC X/XI (DISPOSABLE) ×8 IMPLANT
DRAPE COLUMN DVNC XI (DISPOSABLE) ×2 IMPLANT
DRAPE DA VINCI XI ARM (DISPOSABLE) ×8
DRAPE DA VINCI XI COLUMN (DISPOSABLE) ×2
DRAPE INCISE IOBAN 66X45 STRL (DRAPES) ×4 IMPLANT
DRAPE SHEET LG 3/4 BI-LAMINATE (DRAPES) ×4 IMPLANT
ELECT PENCIL ROCKER SW 15FT (MISCELLANEOUS) ×4 IMPLANT
ELECT REM PT RETURN 15FT ADLT (MISCELLANEOUS) ×4 IMPLANT
EVACUATOR SILICONE 100CC (DRAIN) IMPLANT
GAUZE SPONGE 4X4 12PLY STRL (GAUZE/BANDAGES/DRESSINGS) ×4 IMPLANT
GLOVE BIO SURGEON STRL SZ 6.5 (GLOVE) ×3 IMPLANT
GLOVE BIO SURGEONS STRL SZ 6.5 (GLOVE) ×1
GLOVE BIOGEL M STRL SZ7.5 (GLOVE) ×8 IMPLANT
GOWN STRL REUS W/TWL LRG LVL3 (GOWN DISPOSABLE) ×8 IMPLANT
IRRIG SUCT STRYKERFLOW 2 WTIP (MISCELLANEOUS) ×4
IRRIGATION SUCT STRKRFLW 2 WTP (MISCELLANEOUS) ×2 IMPLANT
KIT BASIN OR (CUSTOM PROCEDURE TRAY) ×4 IMPLANT
LOOP VESSEL MAXI BLUE (MISCELLANEOUS) ×4 IMPLANT
MARKER SKIN DUAL TIP RULER LAB (MISCELLANEOUS) ×4 IMPLANT
NEEDLE INSUFFLATION 14GA 120MM (NEEDLE) ×4 IMPLANT
NS IRRIG 1000ML POUR BTL (IV SOLUTION) ×4 IMPLANT
PORT ACCESS TROCAR AIRSEAL 12 (TROCAR) ×2 IMPLANT
PORT ACCESS TROCAR AIRSEAL 5M (TROCAR) ×2
POSITIONER SURGICAL ARM (MISCELLANEOUS) ×8 IMPLANT
POUCH SPECIMEN RETRIEVAL 10MM (ENDOMECHANICALS) IMPLANT
RELOAD STAPLER WHITE 60MM (STAPLE) IMPLANT
RELOAD WH ECHELON 45 (STAPLE) ×12 IMPLANT
SEAL CANN UNIV 5-8 DVNC XI (MISCELLANEOUS) ×6 IMPLANT
SEAL XI 5MM-8MM UNIVERSAL (MISCELLANEOUS) ×6
SET TRI-LUMEN FLTR TB AIRSEAL (TUBING) ×4 IMPLANT
SOLUTION ELECTROLUBE (MISCELLANEOUS) ×4 IMPLANT
STAPLE ECHEON FLEX 60 POW ENDO (STAPLE) IMPLANT
STAPLER RELOAD WHITE 60MM (STAPLE)
STAPLER VISISTAT 35W (STAPLE) ×4 IMPLANT
SURGIFLO W/THROMBIN 8M KIT (HEMOSTASIS) IMPLANT
SUT ETHILON 2 0 PSLX (SUTURE) IMPLANT
SUT PDS AB 0 CT1 36 (SUTURE) ×8 IMPLANT
SUT V-LOC BARB 180 2/0GR6 GS22 (SUTURE)
SUT VICRYL 0 UR6 27IN ABS (SUTURE) ×4 IMPLANT
SUT VLOC BARB 180 ABS3/0GR12 (SUTURE) ×4
SUTURE V-LC BRB 180 2/0GR6GS22 (SUTURE) IMPLANT
SUTURE VLOC BRB 180 ABS3/0GR12 (SUTURE) ×2 IMPLANT
TAPE CLOTH SURG 6X10 WHT LF (GAUZE/BANDAGES/DRESSINGS) ×4 IMPLANT
TOWEL OR 17X26 10 PK STRL BLUE (TOWEL DISPOSABLE) ×4 IMPLANT
TOWEL OR NON WOVEN STRL DISP B (DISPOSABLE) ×4 IMPLANT
TRAY FOLEY W/METER SILVER 16FR (SET/KITS/TRAYS/PACK) ×4 IMPLANT
TRAY LAPAROSCOPIC (CUSTOM PROCEDURE TRAY) ×4 IMPLANT
TROCAR XCEL 12X100 BLDLESS (ENDOMECHANICALS) ×4 IMPLANT
WATER STERILE IRR 1000ML POUR (IV SOLUTION) ×8 IMPLANT

## 2017-05-16 NOTE — Interval H&P Note (Signed)
History and Physical Interval Note:  05/16/2017 10:49 AM  Devon Johnson  has presented today for surgery, with the diagnosis of LEFT RENAL MASS  The various methods of treatment have been discussed with the patient and family. After consideration of risks, benefits and other options for treatment, the patient has consented to  Procedure(s): ROBOTIC ASSITED RADICAL NEPHRECTOMY (Left) LYMPH NODE DISSECTION/ PARA-AORTIC (N/A) as a surgical intervention .  The patient's history has been reviewed, patient examined, no change in status, stable for surgery.  I have reviewed the patient's chart and labs.  Questions were answered to the patient's satisfaction.     Devon Johnson

## 2017-05-16 NOTE — Op Note (Signed)
Operative Note  Preoperative diagnosis:  1. 12 cm left renal mass concerning for malignancy 2.  1 cm para-aortic lymph node   Postoperative diagnosis: 1. 12 cm left renal mass concerning for malignancy 2.  1 cm para-aortic ganglion cyst  Procedure(s): 1. Robotic-assisted laparoscopic left adrenal sparing nephrectomy 2. Para-aortic lymph node dissection   Surgeon: Ellison Hughs, MD  Assistants:  Debbrah Alar, PA-C  Anesthesia: General endotracheal  Complications: None  EBL: 75 mL  Specimens: 1.  Left kidney 2.  Para-aortic lymphatic tissue  Drains/Catheters: 1. Foley catheter  Intraoperative findings: No gross signs of tumor extension beyond Gerota's fascia or sign of renal vein thrombus.  Frozen section of the lesion noted on his most recent MRI was ganglion tissue and negative for malignancy  Indication: Devon Johnson is a 51 y.o. male who initially presented to the emergency department on 04/21/2017 with worsening left-sided flank pain, nausea/vomiting and gross hematuria.  He had a CT of the abdomen/pelvis without contrast that demonstrated a 12 cm left renal mass with possible extension beyond Gerota's fascia.  He eventually had an MRI of the abdomen with and without contrast that confirmed the solid left renal mass and was negative for signs of tumor thrombus or metastasis, but did show a possible 1 cm para-aortic lymph node.  Description of procedure:  After informed consent was obtained, the patient was brought to the operating room and general endotracheal anesthesia was administered.  The patient was placed in the right lateral decubitus position and prepped and draped in usual sterile fashion.  A timeout was performed.  An 8 mm incision was then made lateral to the left rectus muscle at the level of the left 12th rib.  Abdominal access was obtained via a Veress needle.  The abdominal cavity was then insufflated up to 15 mmHg.  An 8 mm port was then introduced into the  abdominal cavity.  Inspection of the port entry site by the robotic camera revealed no adjacent organ injury.  We then placed 2 additional 8 mm robotic ports to triangulate the left renal hilum.  A 12 mm assistant port was then placed between the upper robotic port and the camera, in the midline.  The white line of Toldt along the descending colon was incised sharply and the colon, along with its mesocolonic fat, was reflected medially until the aorta was identified.  We then made a small window adjacent to the lower pole of the left kidney, identifying the left psoas muscle, left ureter and left gonadal vein.  The left ureter and gonadal vein were then reflected anteriorly allowing [place] to then incised the perihilar attachments using electrocautery.  We encountered a small lumbar vein adjacent to the insertion of the left gonadal vein into the left renal vein as well as a lower pole accessory artery and vein.  This lumbar vein was ligated with hemo-lock clips in 2 places and incised sharply.  The accessory lower pole artery and vein were ligated with the 45 mm endovascular stapler en bloc.  This provided [place] excellent exposure to the left renal hilum.  The perilymphatic tissue surrounding the left renal artery was carefully dissected away, creating a window to place the endovascular stapler.  The left renal artery and vein were then individually ligated with the 45 mm endovascular stapler, achieving excellent hemostasis following ligation. The remainder of the perirenal attachments were then incised using electrocautery. The left ureter and gonadal vein were then ligated with hemolytic clips and incised sharply. The  left kidney was in place and an Endo Catch bag and placed in the left lower quadrant. We then turned our attention to performing the planned lymph node dissection. The lesion in question was identified adjacent to the origin of the left renal artery. The lesion was carefully dissected away from of the  anterior lateral surface of the aorta and sent to pathology for permanent section. The preliminary pathology results was ganglion cells and was negative for malignancy.   The robot was then de-docked,  abdomen was desufflated and all trochars were removed. A left lower quadrant Gibson incision was then made in the left kidney, within the Endo Catch bag, was removed and sent to pathology for permanent section. The Gibson incision was then closed in 2 layers with a 0 Vicryl in the internal oblique fascial layer and a 0 PDS to reapproximate the external oblique fascia. The 12 mm port fascia was then reapproximated with a 0 Vicryl in a figure-of-eight fashion. All incisions were then reapproximated with skin staples and dressed appropriately. The patient tolerated the procedure well and was transferred to the postanesthesia unit in stable condition.  Plan: Monitor on the floor. Likely home in 1-2 days  Cc:  PCP

## 2017-05-16 NOTE — Anesthesia Preprocedure Evaluation (Addendum)
Anesthesia Evaluation  Patient identified by MRN, date of birth, ID band Patient awake    Reviewed: Allergy & Precautions, NPO status , Patient's Chart, lab work & pertinent test results  Airway Mallampati: I       Dental no notable dental hx. (+) Teeth Intact   Pulmonary neg pulmonary ROS,    Pulmonary exam normal breath sounds clear to auscultation       Cardiovascular Normal cardiovascular exam Rhythm:Regular Rate:Normal     Neuro/Psych negative neurological ROS  negative psych ROS   GI/Hepatic negative GI ROS, Neg liver ROS,   Endo/Other  negative endocrine ROS  Renal/GU negative Renal ROS  negative genitourinary   Musculoskeletal negative musculoskeletal ROS (+)   Abdominal (+) + obese,   Peds  Hematology negative hematology ROS (+)   Anesthesia Other Findings   Reproductive/Obstetrics                             Anesthesia Physical Anesthesia Plan  ASA: II  Anesthesia Plan: General   Post-op Pain Management:    Induction: Intravenous  PONV Risk Score and Plan: 3 and Ondansetron, Dexamethasone and Midazolam  Airway Management Planned: Oral ETT  Additional Equipment:   Intra-op Plan:   Post-operative Plan: Extubation in OR  Informed Consent: I have reviewed the patients History and Physical, chart, labs and discussed the procedure including the risks, benefits and alternatives for the proposed anesthesia with the patient or authorized representative who has indicated his/her understanding and acceptance.   Dental advisory given  Plan Discussed with: CRNA and Surgeon  Anesthesia Plan Comments:         Anesthesia Quick Evaluation

## 2017-05-16 NOTE — Anesthesia Procedure Notes (Signed)
Procedure Name: Intubation Date/Time: 05/16/2017 11:40 AM Performed by: Glory Buff Pre-anesthesia Checklist: Patient identified, Emergency Drugs available, Suction available and Patient being monitored Patient Re-evaluated:Patient Re-evaluated prior to induction Oxygen Delivery Method: Circle system utilized Preoxygenation: Pre-oxygenation with 100% oxygen Induction Type: IV induction Ventilation: Mask ventilation without difficulty Laryngoscope Size: Miller and 3 Grade View: Grade I Tube type: Oral Tube size: 7.5 mm Number of attempts: 1 Airway Equipment and Method: Stylet and Oral airway Placement Confirmation: ETT inserted through vocal cords under direct vision,  positive ETCO2 and breath sounds checked- equal and bilateral Secured at: 21 cm Tube secured with: Tape Dental Injury: Teeth and Oropharynx as per pre-operative assessment

## 2017-05-16 NOTE — Anesthesia Postprocedure Evaluation (Signed)
Anesthesia Post Note  Patient: Devon Johnson  Procedure(s) Performed: ROBOTIC ASSITED RADICAL NEPHRECTOMY (Left ) LYMPH NODE DISSECTION/ PARA-AORTIC (N/A )     Patient location during evaluation: PACU Anesthesia Type: General Level of consciousness: awake and alert Pain management: pain level controlled Vital Signs Assessment: post-procedure vital signs reviewed and stable Respiratory status: spontaneous breathing, nonlabored ventilation and respiratory function stable Cardiovascular status: blood pressure returned to baseline and stable Postop Assessment: no apparent nausea or vomiting Anesthetic complications: no    Last Vitals:  Vitals:   05/16/17 1545 05/16/17 1600  BP: (!) 160/93 (!) 148/95  Pulse: 71 66  Resp: 12 12  Temp:    SpO2: 97% 97%    Last Pain:  Vitals:   05/16/17 1600  TempSrc:   PainSc: 4                  Alyssia Heese,W. EDMOND

## 2017-05-16 NOTE — Discharge Instructions (Signed)

## 2017-05-16 NOTE — Transfer of Care (Signed)
Immediate Anesthesia Transfer of Care Note  Patient: Devon Johnson  Procedure(s) Performed: Procedure(s): ROBOTIC ASSITED RADICAL NEPHRECTOMY (Left) LYMPH NODE DISSECTION/ PARA-AORTIC (N/A)  Patient Location: PACU  Anesthesia Type:General  Level of Consciousness:  sedated, patient cooperative and responds to stimulation  Airway & Oxygen Therapy:Patient Spontanous Breathing and Patient connected to face mask oxgen  Post-op Assessment:  Report given to PACU RN and Post -op Vital signs reviewed and stable  Post vital signs:  Reviewed and stable  Last Vitals:  Vitals:   05/16/17 0906  BP: (!) 159/93  Pulse: 80  Resp: 18  Temp: 36.8 C  SpO2: 852%    Complications: No apparent anesthesia complications

## 2017-05-17 LAB — BASIC METABOLIC PANEL WITH GFR
Anion gap: 9 (ref 5–15)
BUN: 12 mg/dL (ref 6–20)
CO2: 27 mmol/L (ref 22–32)
Calcium: 8.6 mg/dL — ABNORMAL LOW (ref 8.9–10.3)
Chloride: 101 mmol/L (ref 101–111)
Creatinine, Ser: 1.44 mg/dL — ABNORMAL HIGH (ref 0.61–1.24)
GFR calc Af Amer: >60 mL/min
GFR calc non Af Amer: 54 mL/min — ABNORMAL LOW
Glucose, Bld: 137 mg/dL — ABNORMAL HIGH (ref 65–99)
Potassium: 4 mmol/L (ref 3.5–5.1)
Sodium: 137 mmol/L (ref 135–145)

## 2017-05-17 LAB — HEMOGLOBIN AND HEMATOCRIT, BLOOD
HEMATOCRIT: 43.2 % (ref 39.0–52.0)
HEMOGLOBIN: 14.2 g/dL (ref 13.0–17.0)

## 2017-05-17 NOTE — Progress Notes (Signed)
Patient states he has voided multiple times.  Educated patient to void in urinal so that we can measure his output.  Patient has done well today, ambulated multiple times and tolerating diet well.  Wife at bedside.  Will continue to monitor.

## 2017-05-17 NOTE — Progress Notes (Signed)
1 Day Post-Op Subjective: No acute events overnight.  Pain controlled. Denies n/v.  Ambulating w/o difficulty  Objective: Vital signs in last 24 hours: Temp:  [97.9 F (36.6 C)-98.6 F (37 C)] 98.2 F (36.8 C) (10/10 0448) Pulse Rate:  [66-79] 69 (10/10 0448) Resp:  [12-17] 16 (10/10 0448) BP: (148-164)/(85-101) 158/89 (10/10 0448) SpO2:  [96 %-100 %] 99 % (10/10 0448)  Intake/Output from previous day: 10/09 0701 - 10/10 0700 In: 4295 [P.O.:360; I.V.:3885; IV Piggyback:50] Out: 3125 [Urine:3075; Blood:50]  Intake/Output this shift: No intake/output data recorded.  Physical Exam:  General: Alert and oriented CV: RRR, palpable distal pulses Lungs: CTAB, equal chest rise Abdomen: Soft, NTND, no rebound or guarding Incisions: c/d/i Ext: NT, No erythema  Lab Results:  Recent Labs  05/16/17 1519 05/17/17 0434  HGB 13.9  14.0 14.2  HCT 42.4  42.1 43.2   BMET  Recent Labs  05/16/17 1519 05/17/17 0434  NA  --  137  K  --  4.0  CL  --  101  CO2  --  27  GLUCOSE  --  137*  BUN  --  12  CREATININE 1.32* 1.44*  CALCIUM  --  8.6*     Studies/Results: No results found.  Assessment/Plan:  POD1 s/p left robotic nephrectomy  -Advance diet -INT IVF -OOBTC and ambulate -d/c Foley -Likely home tomorrow    LOS: 1 day   Ellison Hughs, MD 05/17/2017, 10:06 AM  [facility] Pager: [phone]

## 2017-05-17 NOTE — Progress Notes (Signed)
Patient ambulated x1 around unit.  Tolerated very well.  Patient to chair with wife at bedside.  Patient has no c/o excessive pain, nausea or vomiting; will progress to regular diet per previous electronic order.  Advised patient to order soft, easily digestible foods at this time.  Will continue to monitor patient.

## 2017-05-18 MED ORDER — DOCUSATE SODIUM 100 MG PO CAPS: 100.0000 mg | ORAL_CAPSULE | Freq: Two times a day (BID) | ORAL | 0 refills | Status: AC

## 2017-05-18 MED ORDER — HYDROCODONE-ACETAMINOPHEN 5-325 MG PO TABS: 1.0000 | ORAL_TABLET | ORAL | 0 refills | Status: AC | PRN

## 2017-05-18 MED ORDER — PROMETHAZINE HCL 12.5 MG PO TABS: 12.5000 mg | ORAL_TABLET | ORAL | 0 refills | Status: AC | PRN

## 2017-05-18 NOTE — Discharge Summary (Signed)
Date of admission: 05/16/2017  Date of discharge: 05/18/2017  Admission diagnosis: Left renal mass Discharge diagnosis: Left renal mass s/p left robotic nephrectomy and lymph node dissection   History and Physical: For full details, please see admission history and physical. Briefly, Devon Johnson is a 51 y.o. year old patient who initially presented to the emergency department on 04/21/2017 with worsening left-sided flank pain, nausea/vomiting and gross hematuria.  He had a CT of the abdomen/pelvis without contrast that demonstrated a 12 cm left renal mass with possible extension beyond Gerota's fascia.  He eventually had an MRI of the abdomen with and without contrast that confirmed the solid left renal mass and was negative for signs of tumor thrombus.  His MRI did show possible 1 cm para-aortic lymph node.  Currently, the patient is still having intermittent left-sided flank pain, but his hematuria has improved.  He has a strong family history of renal cell carcinoma involving his father, who passed away in his 85s secondary to metastatic disease.  Hospital Course:  Post operatively, the patient was monitored on the floor.  By POD 2, the patient was ambulating, tolerating a regular diet, passing flatus and his pain was controlled Laboratory values:  Recent Labs  05/16/17 1519 05/17/17 0434  HGB 13.9  14.0 14.2  HCT 42.4  42.1 43.2    Recent Labs  05/16/17 1519 05/17/17 0434  CREATININE 1.32* 1.44*    Disposition: Home  Discharge instruction: The patient was instructed to be ambulatory but told to refrain from heavy lifting, strenuous activity, or driving. He will follow up on 05/26/17 to remove his skin staples and discuss his pathology results.  Discharge medications: Allergies as of 05/18/2017   No Known Allergies     Medication List    STOP taking these medications   naproxen 500 MG tablet Commonly known as:  NAPROSYN   ondansetron 8 MG disintegrating  tablet Commonly known as:  ZOFRAN ODT   oxyCODONE-acetaminophen 5-325 MG tablet Commonly known as:  PERCOCET/ROXICET     TAKE these medications   docusate sodium 100 MG capsule Commonly known as:  COLACE Take 1 capsule (100 mg total) by mouth 2 (two) times daily.   HYDROcodone-acetaminophen 5-325 MG tablet Commonly known as:  NORCO Take 1-2 tablets by mouth every 6 (six) hours as needed for moderate pain or severe pain.   HYDROcodone-acetaminophen 5-325 MG tablet Commonly known as:  NORCO/VICODIN Take 1-2 tablets by mouth every 4 (four) hours as needed for moderate pain.   promethazine 12.5 MG tablet Commonly known as:  PHENERGAN Take 1 tablet (12.5 mg total) by mouth every 4 (four) hours as needed for nausea or vomiting.       Followup:  Follow-up Information    Ceasar Mons, MD On 05/26/2017.   Specialty:  Urology Why:  at 11:00 Contact information: [address] [place] [address] [phone]

## 2017-05-18 NOTE — Progress Notes (Signed)
2 Days Post-Op Subjective: No acute events overnight.  Pain controlled. Tolerating a regular diet and passing flatus.  Denies N/V. Objective: Vital signs in last 24 hours: Temp:  [98.2 F (36.8 C)-98.4 F (36.9 C)] 98.3 F (36.8 C) (10/11 0454) Pulse Rate:  [72-75] 75 (10/11 0454) Resp:  [18] 18 (10/11 0454) BP: (145)/(80-95) 145/85 (10/11 0454) SpO2:  [95 %-99 %] 95 % (10/11 0454)  Intake/Output from previous day: 10/10 0701 - 10/11 0700 In: 480 [P.O.:480] Out: 1800 [Urine:1800]  Intake/Output this shift: No intake/output data recorded.  Physical Exam:  General: Alert and oriented CV: RRR, palpable distal pulses Lungs: CTAB, equal chest rise Abdomen: Soft, NTND, no rebound or guarding Incisions: c/d/i Ext: NT, No erythema  Lab Results:  Recent Labs  05/16/17 1519 05/17/17 0434  HGB 13.9  14.0 14.2  HCT 42.4  42.1 43.2   BMET  Recent Labs  05/16/17 1519 05/17/17 0434  NA  --  137  K  --  4.0  CL  --  101  CO2  --  27  GLUCOSE  --  137*  BUN  --  12  CREATININE 1.32* 1.44*  CALCIUM  --  8.6*     Studies/Results: No results found.  Assessment/Plan:  POD s/p left robotic nephrectomy  -OOBTC and amblate -Regular diet -Home today     LOS: 2 days   Ellison Hughs, MD 05/18/2017, 8:38 AM  [facility] Pager: [phone]

## 2017-06-01 ENCOUNTER — Encounter: Payer: Self-pay | Admitting: Genetic Counselor

## 2017-06-01 ENCOUNTER — Telehealth: Payer: Self-pay | Admitting: Genetic Counselor

## 2017-06-01 NOTE — Telephone Encounter (Signed)
Genetic counseling appt has been scheduled for the pt to see Steele Berg on 11/8 at [place] from [facility] will notify the pt. Letter mailed.

## 2017-06-15 ENCOUNTER — Ambulatory Visit (HOSPITAL_BASED_OUTPATIENT_CLINIC_OR_DEPARTMENT_OTHER): Payer: BC Managed Care – PPO | Admitting: Genetic Counselor

## 2017-06-15 ENCOUNTER — Other Ambulatory Visit: Payer: BC Managed Care – PPO

## 2017-06-15 ENCOUNTER — Encounter: Payer: Self-pay | Admitting: Genetic Counselor

## 2017-06-15 DIAGNOSIS — Z1379 Encounter for other screening for genetic and chromosomal anomalies: Secondary | ICD-10-CM

## 2017-06-15 DIAGNOSIS — Z7183 Encounter for nonprocreative genetic counseling: Secondary | ICD-10-CM

## 2017-06-15 DIAGNOSIS — Z8051 Family history of malignant neoplasm of kidney: Secondary | ICD-10-CM | POA: Diagnosis not present

## 2017-06-15 DIAGNOSIS — C642 Malignant neoplasm of left kidney, except renal pelvis: Secondary | ICD-10-CM | POA: Diagnosis not present

## 2017-06-15 HISTORY — DX: Encounter for other screening for genetic and chromosomal anomalies: Z13.79

## 2017-06-15 NOTE — Progress Notes (Signed)
[facility]      Initial Visit   Patient Name: Devon Johnson Patient DOB: Apr 14, 1966 Patient Age: 51 y.o. Encounter Date: 06/15/2017  Referring Provider: Ellison Hughs, MD  Primary Care Provider: Orpah Melter, MD  Reason for Visit: Evaluate for hereditary susceptibility to cancer    Assessment and Plan:  . Devon Johnson history is concerning for a hereditary predisposition to cancer, but it is likely his genetic testing will not explain why he and his father both had kidney cancer as they have no other features of known cancer syndromes.  . Testing is recommended to determine whether he has a pathogenic mutation that will impact his screening and risk-reduction for cancer. A negative result will be generally reassuring, but family members need to know they do have an elevated risk due to the family history.  . Devon Johnson wished to pursue genetic testing and a blood sample will be sent for analysis of the 83 genes on [facility]'s Multi-Cancer panel (ALK, APC, ATM, AXIN2, BAP1, BARD1, BLM, BMPR1A, BRCA1, BRCA2, BRIP1, CASR, CDC73, CDH1, CDK4, CDKN1B, CDKN1C, CDKN2A, CEBPA, CHEK2, CTNNA1, DICER1, DIS3L2, EGFR, EPCAM, FH, FLCN, GATA2, GPC3, GREM1, HOXB13, HRAS, KIT, MAX, MEN1, MET, MITF, MLH1, MSH2, MSH3, MSH6, MUTYH, NBN, NF1, NF2, NTHL1, PALB2, PDGFRA, PHOX2B, PMS2, POLD1, POLE, POT1, PRKAR1A, PTCH1, PTEN, RAD50, RAD51C, RAD51D, RB1, RECQL4, RET, RUNX1, SDHA, SDHAF2, SDHB, SDHC, SDHD, SMAD4, SMARCA4, SMARCB1, SMARCE1, STK11, SUFU, TERC, TERT, TMEM127, TP53, TSC1, TSC2, VHL, WRN, WT1).   . Results should be available in approximately 2-4 weeks, at which point we will contact him and address implications for him as well as address genetic testing for at-risk family members, if needed.     Dr. Jana Hakim was available for questions concerning this case. Total time spent by me in face-to-face counseling was approximately 35 minutes.    _____________________________________________________________________   History of Present Illness: Devon Johnson, a 51 y.o. male, is being seen at the [facility] due to a personal and family history of kidney cancer. He presents to clinic today with his wife, Devon Johnson, to discuss the possibility of a hereditary predisposition to cancer and discuss whether genetic testing is warranted.  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 He is s/p left radical nephrectomy and does not require additional treatment.   Devon Johnson has not yet initiated colon cancer screenings.  Past Medical History:  Diagnosis Date  . History of kidney stones   . Left renal mass   . Renal cell carcinoma of left kidney [facility])     Past Surgical History:  Procedure Laterality Date  . NO PAST SURGERIES    . robotic radical nephrectomy     Dr. Lovena Johnson 05/16/17    Social History   Socioeconomic History  . Marital status: Married    Spouse name: Not on file  . Number of children: Not on file  . Years of education: Not on file  . Highest education level: Not on file  Social Needs  . Financial resource strain: Not on file  . Food insecurity - worry: Not on file  . Food insecurity - inability: Not on file  . Transportation needs - medical: Not on file  . Transportation needs - non-medical: Not on file  Occupational History  . Not on file  Tobacco Use  . Smoking status: Never Smoker  . Smokeless tobacco: Never Used  Substance and Sexual  Activity  . Alcohol use: Yes    Comment: occasional  . Drug use: No  . Sexual activity: Yes  Other Topics Concern  . Not on file  Social History Narrative  . Not on file     Family History:  During the visit, a 4-generation pedigree was obtained. Family tree will be scanned in the Media tab in Epic  Significant diagnoses include the following:  Family History  Problem Relation Age of Onset   . Kidney cancer Father 65       mets 89; deceased 34  . Cancer Maternal Grandmother        gynecologic (unspecified); deceased 32    Additionally, Devon Johnson has a son (age 28) and a daughter (age 20). He has a brother (age 62) and a sister (age 98). They are reportedly healthy. His mother (age 8) has one sister (age 62s). His father had only one brother who died at 40.  Devon Johnson ancestry is Caucasian - NOS. There is no known Jewish ancestry and no consanguinity.  Discussion: We reviewed the characteristics, features and inheritance patterns of hereditary cancer syndromes with a focus on hereditary kidney cancer predisposition. We discussed his risk of harboring a mutation in the context of his personal and family history. We discussed that his small familymakes risk assessment challenging. We discussed the process of genetic testing, insurance coverage and implications of results: positive, negative and variant of unknown significance (VUS).    Devon Johnson questions were answered to his satisfaction today and he is welcome to call with any additional questions or concerns. Thank you for the referral and allowing [place] to share in the care of your patient.    Devon Berg, MS, [place] Certified Genetic Counselor phone: [phone] [email or web address]   ______________________________________________________________________ For Office Staff:  Number of people involved in session: 2 Was an Intern/ student involved with case: no

## 2017-06-23 ENCOUNTER — Ambulatory Visit: Payer: Self-pay | Admitting: Genetic Counselor

## 2017-06-23 ENCOUNTER — Encounter: Payer: Self-pay | Admitting: Genetic Counselor

## 2017-06-23 DIAGNOSIS — Z1379 Encounter for other screening for genetic and chromosomal anomalies: Secondary | ICD-10-CM

## 2017-06-23 NOTE — Progress Notes (Signed)
[facility]       Genetic Test Results    Patient Name: Devon Johnson Patient DOB: 07-Jan-1966 Patient Age: 51 y.o. Encounter Date: 06/23/2017  Referring Provider: Ellison Hughs, MD  Primary Care Provider: Orpah Melter, MD   Devon Johnson was called today to discuss genetic test results. Please see the Genetics note from his visit on 06/15/2017 for a detailed discussion of his personal and family history.  Genetic Testing: At the time of Devon Johnson visit, he decided to pursue genetic testing of multiple genes associated with hereditary susceptibility to cancer. Testing included sequencing and deletion/duplication analysis. Testing did not reveal any pathogenic mutation in any of these genes.  A copy of the genetic test report will be scanned into Epic under the media tab.  The genes analyzed were the 83 genes on [facility]'s Multi-Cancer panel (ALK, APC, ATM, AXIN2, BAP1, BARD1, BLM, BMPR1A, BRCA1, BRCA2, BRIP1, CASR, CDC73, CDH1, CDK4, CDKN1B, CDKN1C, CDKN2A, CEBPA, CHEK2, CTNNA1, DICER1, DIS3L2, EGFR, EPCAM, FH, FLCN, GATA2, GPC3, GREM1, HOXB13, HRAS, KIT, MAX, MEN1, MET, MITF, MLH1, MSH2, MSH3, MSH6, MUTYH, NBN, NF1, NF2, NTHL1, PALB2, PDGFRA, PHOX2B, PMS2, POLD1, POLE, POT1, PRKAR1A, PTCH1, PTEN, RAD50, RAD51C, RAD51D, RB1, RECQL4, RET, RUNX1, SDHA, SDHAF2, SDHB, SDHC, SDHD, SMAD4, SMARCA4, SMARCB1, SMARCE1, STK11, SUFU, TERC, TERT, TMEM127, TP53, TSC1, TSC2, VHL, WRN, WT1).  Since the current test is not perfect, it is possible that there may be a gene mutation that current testing cannot detect, but that chance is small. It is possible that a different genetic factor, which has not yet been discovered or is not on this panel, is responsible for the cancer diagnoses in the family. Again, the likelihood of this is low. No additional testing is recommended at this time for Devon Johnson.  A Variant of Uncertain Significance was detected: ATM [id number]  (p.[id number]). This is still considered a normal result. While at this time, it is unknown if this finding is associated with increased cancer risk or not, the majority of these variants get reclassified to be inconsequential. We emphasized that medical management should not be based on this finding. With time, we suspect the lab will determine the significance, if any. If we do learn more about it, we will try to contact Devon Johnson to discuss it further. It is important to stay in touch with [place] periodically and keep the address and phone number up to date.  Cancer Screening: We discussed continuing to follow the cancer screening guidelines provided by his physician.   Family Members: Family members are at some increased risk of developing cancer, over the general population risk, simply due to the family history. Family members are recommended to speak with their own providers about appropriate cancer screenings.  Any relative who had cancer at a young age or had a particularly rare cancer may also wish to pursue genetic testing. Genetic counselors can be located in other cities, by visiting the website of the [employer] ([employer]) and Field seismologist for a Dietitian by zip code. Family members are not recommended to get tested for the above VUS outside of a research protocol as this finding has no implications for their medical management.  Lastly, cancer genetics is a rapidly advancing field and it is possible that new genetic tests will be appropriate for Devon Johnson in the future. We encourage him to remain in contact with [place] on an annual  basis so we can update his personal and family histories, and let him know of advances in cancer genetics that may benefit the family. Our contact number was provided. Devon Johnson is welcome to call anytime with additional questions.     Steele Berg, MS, [place] Certified Genetic Counselor phone: [phone]

## 2018-08-10 ENCOUNTER — Ambulatory Visit (HOSPITAL_COMMUNITY)
Admission: RE | Admit: 2018-08-10 | Discharge: 2018-08-10 | Disposition: A | Discharge status: Home / Self Care | Payer: BC Managed Care – PPO | Source: Ambulatory Visit | Attending: Emergency Medicine | Admitting: Emergency Medicine

## 2018-08-10 ENCOUNTER — Other Ambulatory Visit (HOSPITAL_COMMUNITY): Payer: Self-pay | Admitting: Emergency Medicine

## 2018-08-10 DIAGNOSIS — D49512 Neoplasm of unspecified behavior of left kidney: Secondary | ICD-10-CM

## 2019-01-22 IMAGING — MR MR ABDOMEN WO/W CM
9 of 18 series · 21 of 48 positions shown · IV contrast (multihance)
Comparison: Noncontrast CT on 04/21/2017

CLINICAL DATA: Left renal mass and hydronephrosis on recent CT .

EXAM:
MRI ABDOMEN WITHOUT AND WITH CONTRAST
TECHNIQUE: Multiplanar multisequence MR imaging of the abdomen was performed
both before and after the administration of intravenous contrast.
CONTRAST:  20mL MULTIHANCE GADOBENATE DIMEGLUMINE 529 MG/ML IV SOLN

[Series 3: DWI b500 · axial · 6.0mm · 1.56mm/px · z∈[-140,+133]mm · 2 of 72 slices shown]
[im 1/72]
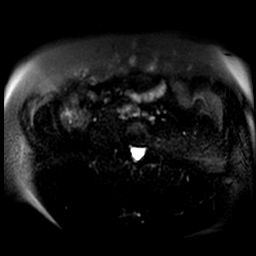
[im 72/72]
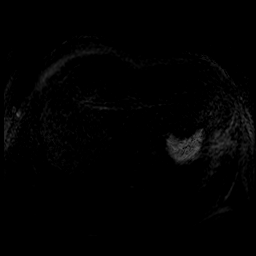

[Series 4: T2 fat-sat · axial · 5.0mm · 0.78mm/px · z∈[-132,+143]mm · 2 of 56 slices shown]
[im 1/56]
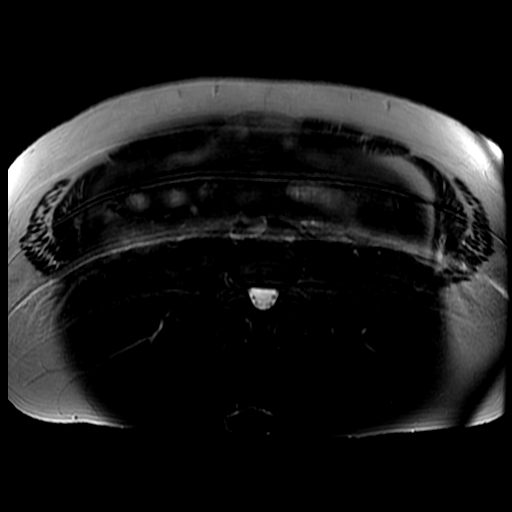
[im 56/56]
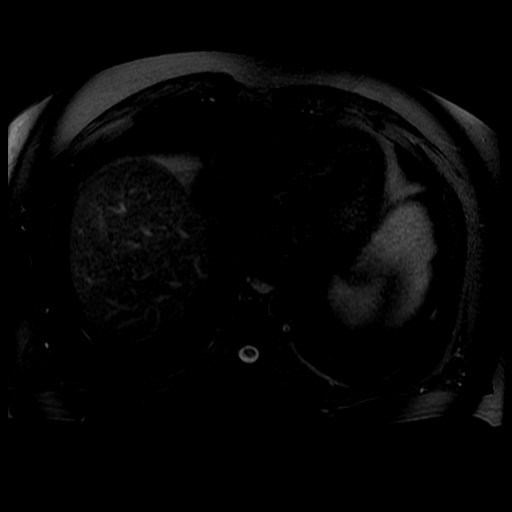

[Series 5: ax dualecho · axial · 5.0mm · 0.84mm/px · z∈[-149,+136]mm · 4 of 116 slices shown]
[im 1/116]
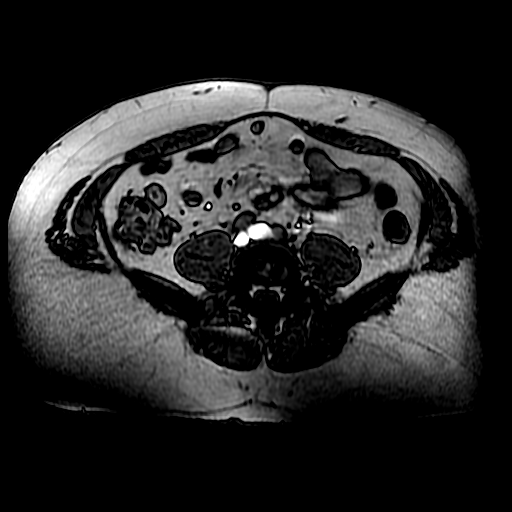
[im 39/116]
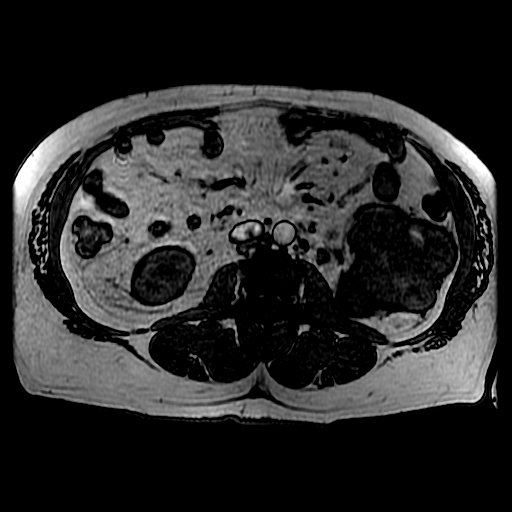
[im 77/116]
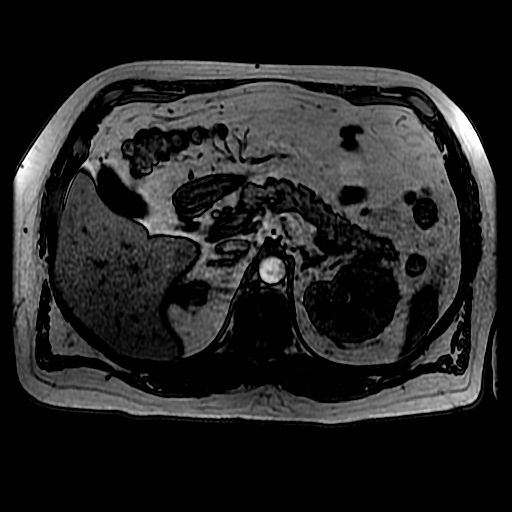
[im 116/116]
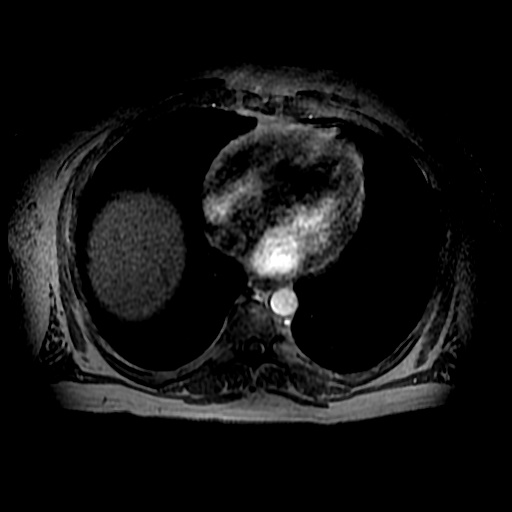

[Series 6: T2 · axial · 5.0mm · 0.84mm/px · z∈[-149,+136]mm · 2 of 58 slices shown (1 of 2)]
[im 1/58]
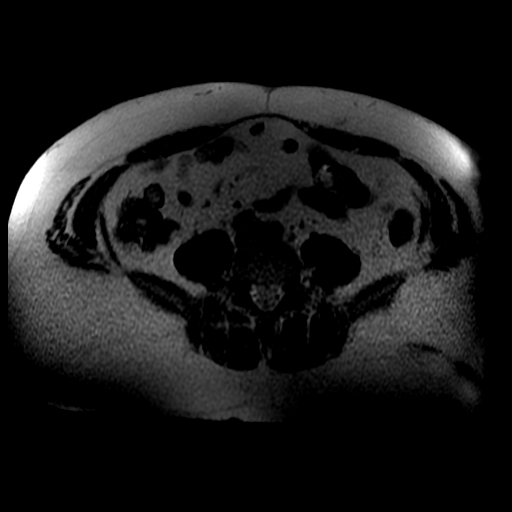
[im 58/58]
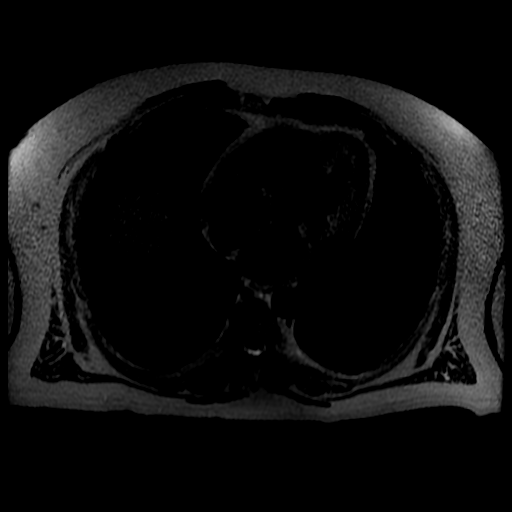

[Series 7: T2 · coronal · 5.0mm · 0.84mm/px · 2 of 51 slices shown (2 of 2)]
[im 1/51]
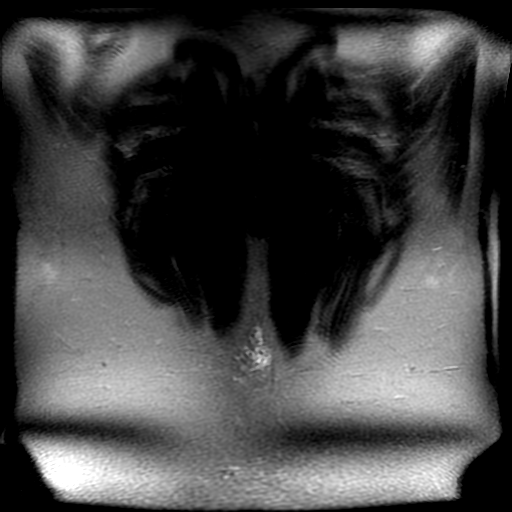
[im 51/51]
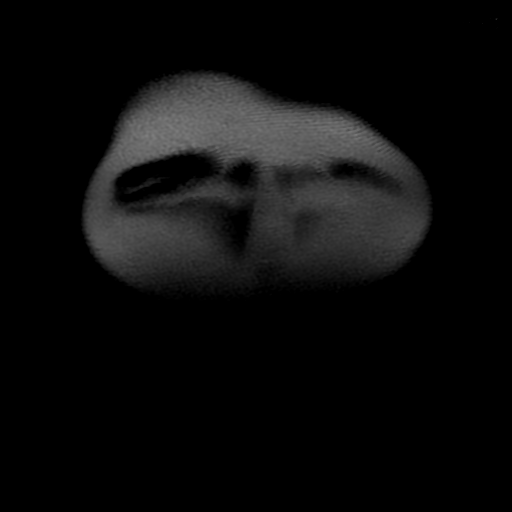

[Series 8: bSSFP · axial · 5.0mm · 0.84mm/px · z∈[-149,+136]mm · 2 of 58 slices shown]
[im 1/58]
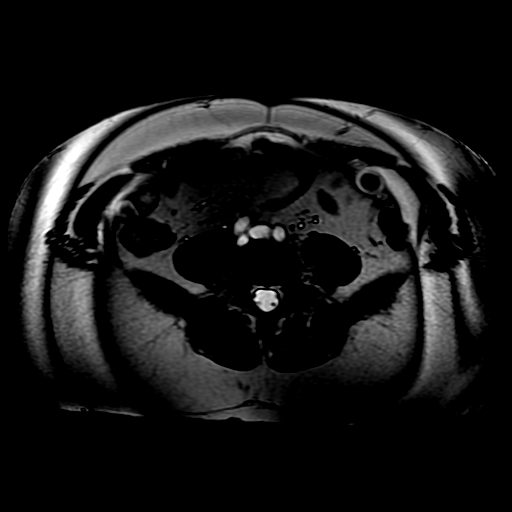
[im 58/58]
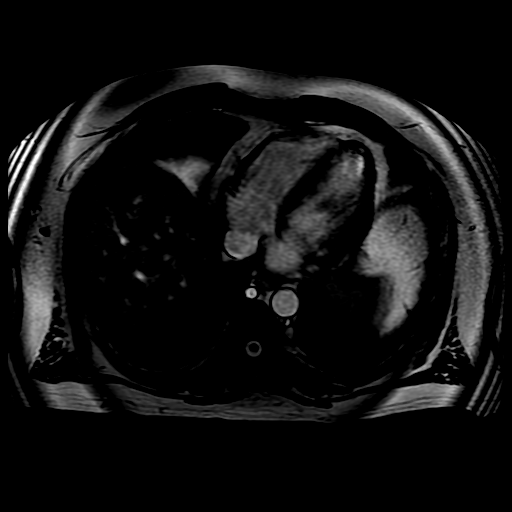

[Series 300: DWI · axial · 6.0mm · 1.56mm/px · 1 of 36 slices shown]
[im 1/36]
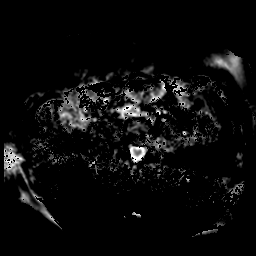

[Series 900: T1 dynamic · axial · 6.0mm · 0.84mm/px · z∈[-147,+114]mm · 3 of 88 slices shown (1 of 2)]
[im 1/88]
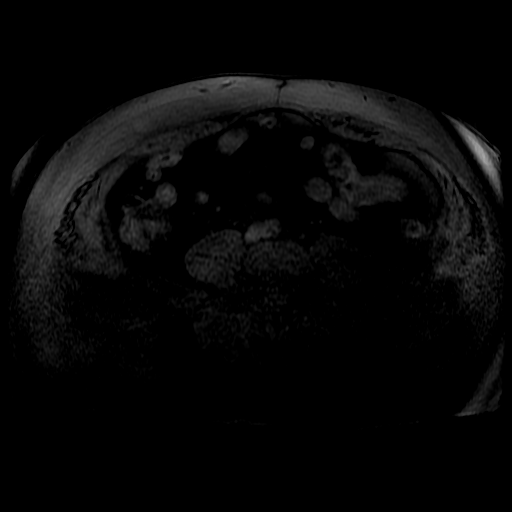
[im 44/88]
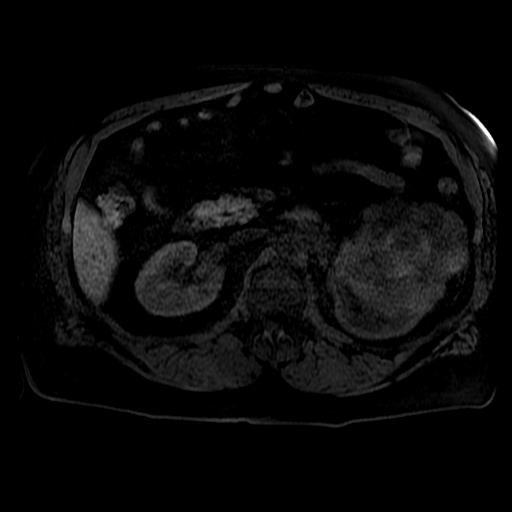
[im 88/88]
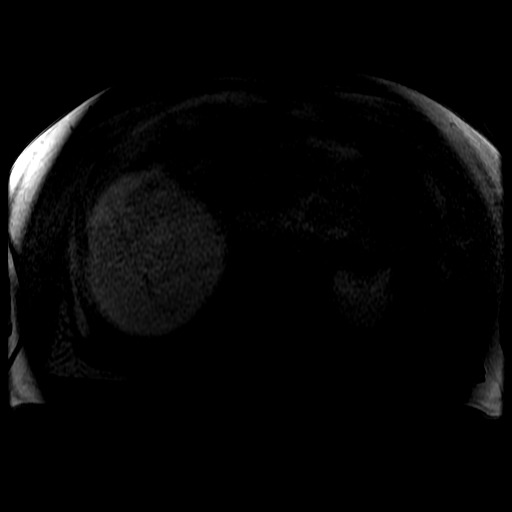

[Series 901: T1 dynamic · axial · 6.0mm · 0.84mm/px · z∈[-147,+114]mm · 3 of 88 slices shown (2 of 2)]
[im 1/88]
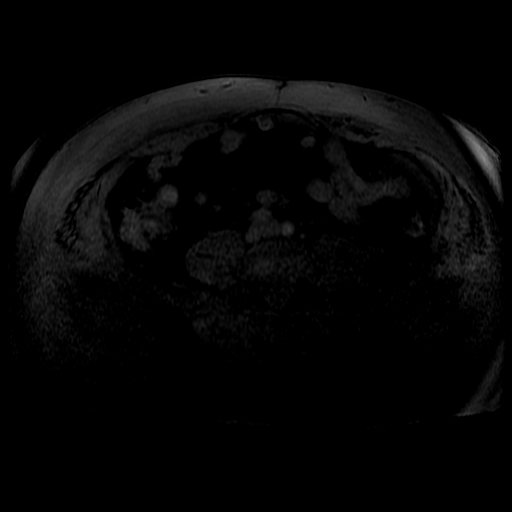
[im 44/88]
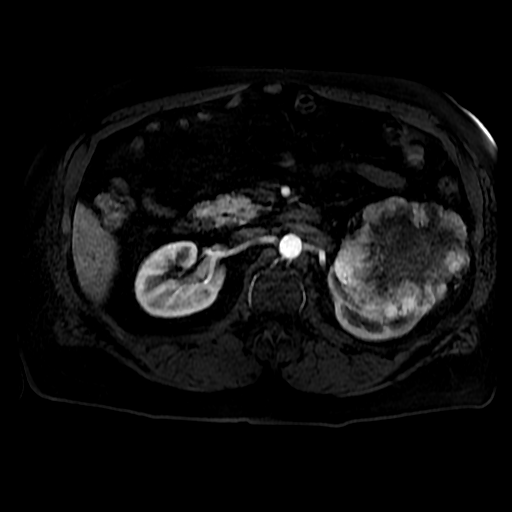
[im 88/88]
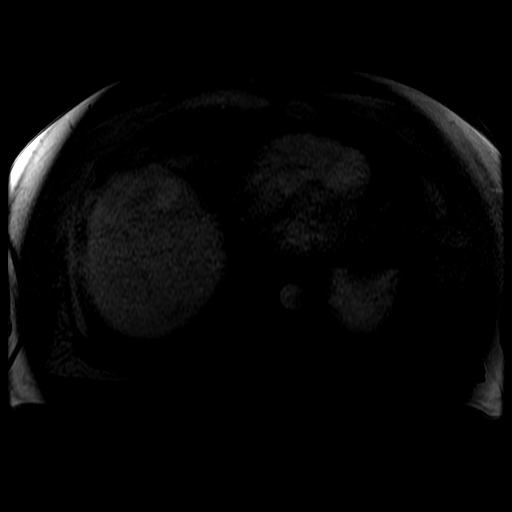

[21 of 48 positions shown; findings below may reference images not displayed]

FINDINGS: Lower chest: No acute findings.

Hepatobiliary: No hepatic masses identified. A few tiny sub-cm cysts
are noted in the left hepatic lobe. Gallbladder is unremarkable.

Pancreas:  No mass or inflammatory changes.

Spleen:  Within normal limits in size and appearance.

Adrenals/Urinary Tract: Normal adrenal glands. Enlarged enhancing
mass with central necrosis is seen involving the anterior mid and
lower poles of the left kidney. This measures 11.4 x 10.1 cm, is
consistent with renal cell carcinoma. This extends to Gerota's
fascia, although there is no evidence of extension through Gerota's
fascia. A small benign-appearing cyst is noted in the upper pole the
left kidney. Right kidney is normal in appearance. No evidence of
hydronephrosis.

Stomach/Bowel: Visualized portions within the abdomen are
unremarkable.

Vascular/Lymphatic: 10 mm retroperitoneal lymph node in the left
paraaortic region (image 55/902), suspicious for lymph node
metastasis. No other sites of lymphadenopathy identified. No
evidence of renal vein or IVC thrombus. No abdominal aortic
aneurysm.

Other:  None.

Musculoskeletal:  No suspicious bone lesions identified.
IMPRESSION: 11.4 cm mass with central necrosis involving the mid and lower poles
of the left kidney, consistent with renal cell carcinoma.

1 cm left paraaortic lymph node, suspicious for lymph node
metastasis. No other lymphadenopathy or renal vein/IVC extension.

## 2020-04-08 ENCOUNTER — Other Ambulatory Visit (HOSPITAL_BASED_OUTPATIENT_CLINIC_OR_DEPARTMENT_OTHER): Payer: Self-pay | Admitting: Family Medicine

## 2020-04-08 DIAGNOSIS — N289 Disorder of kidney and ureter, unspecified: Secondary | ICD-10-CM

## 2020-04-14 ENCOUNTER — Ambulatory Visit (HOSPITAL_BASED_OUTPATIENT_CLINIC_OR_DEPARTMENT_OTHER)
Admission: RE | Admit: 2020-04-14 | Discharge: 2020-04-14 | Disposition: A | Discharge status: Home / Self Care | Payer: BC Managed Care – PPO | Source: Ambulatory Visit | Attending: Family Medicine | Admitting: Family Medicine

## 2020-04-14 ENCOUNTER — Other Ambulatory Visit: Payer: Self-pay

## 2020-04-14 DIAGNOSIS — N289 Disorder of kidney and ureter, unspecified: Secondary | ICD-10-CM | POA: Diagnosis not present

## 2022-09-13 ENCOUNTER — Telehealth: Payer: Self-pay | Admitting: Physician Assistant

## 2022-09-13 NOTE — Telephone Encounter (Signed)
Scheduled appt per 2/5 referral. Pt is aware of appt date and time. Pt is aware to arrive 15 mins prior to appt time and to bring and updated insurance card. Pt is aware of appt location.   

## 2022-09-21 NOTE — Progress Notes (Deleted)
[place] [phone]   [phone]  CONSULT NOTE  REFERRING PHYSICIAN: Dr. Gilford Rile  REASON FOR CONSULTATION:  History of renal cell carcinoma with new mediastinal adenopathy   HPI Devon Johnson is a 57 y.o. male with no significant past medical history except for pT3a renal cell carcinoma involving the left kidney status post left radical nephrectomy on 05/16/2017 is referred to the clinic for mediastinal adenopathy on surveillance imaging.  The patient recently had a follow-up visit with Dr. Gilford Rile on 09/09/2022.  Dr. Gilford Rile follows the patient due to his history of renal cell carcinoma for which she underwent  left radical nephrectomy on 05/16/2017.  T at that time, the renal cell carcinoma was grade 3 spanning 10 cm involving the renal sinus fat.  The resection margins were negative.  1 of 1 lymph nodes were negative for carcinoma.  He patient is followed closely by [facility] for his history of renal cell carcinoma.  Denies any recent upper respiratory infections, illnesses, fever, or unintentional weight loss or any other constitutional symptoms.  He had a surveillance CT scan on 09/02/22 showing a high AP window lymph node measuring 2.5 in the short axis previously 6 mm.  There is no axillary or supraclavicular lymphadenopathy.  There is no additional signs of adenopathy in the chest.  The lymph node is heterogeneous and vascular.  There were no signs of metastasis or disease recurrence in the abdomen.  The patient was referred to the clinic to discuss salvage therapeutic options.  Overall the patient is feeling well physically today.  He is of course understandably nervous about his condition.  He denies any fever, chills, night sweats, unexplained weight loss.  Denies any nausea, vomiting, diarrhea, or constipation.  Denies any back pain.  Denies any abdominal pain.  Denies any dysuria, malodorous urine, or any urinary complaints.  Denies any  chest pain, shortness of breath, cough, or hemoptysis.  He denies any personal history of any autoimmune disorders.   HPI  Past Medical History:  Diagnosis Date   Genetic testing 06/15/2017   Multi-Cancer panel (83 genes) @ [facility] - No pathogenic mutations detected   History of kidney stones    Left renal mass    Renal cell carcinoma of left kidney [facility])     Past Surgical History:  Procedure Laterality Date   LYMPH NODE DISSECTION N/A 05/16/2017   Procedure: LYMPH NODE DISSECTION/ PARA-AORTIC;  Surgeon: Ceasar Mons, MD;  Location: [facility];  Service: Urology;  Laterality: N/A;   NO PAST SURGERIES     ROBOTIC ASSITED PARTIAL NEPHRECTOMY Left 05/16/2017   Procedure: ROBOTIC ASSITED RADICAL NEPHRECTOMY;  Surgeon: Ceasar Mons, MD;  Location: [facility];  Service: Urology;  Laterality: Left;   robotic radical nephrectomy     Dr. Lovena Neighbours 05/16/17    Family History  Problem Relation Age of Onset   Kidney cancer Father 52       mets 37; deceased 56   Cancer Maternal Grandmother        gynecologic (unspecified); deceased 76    Social History Social History   Tobacco Use   Smoking status: Never   Smokeless tobacco: Never  Vaping Use   Vaping Use: Never used  Substance Use Topics   Alcohol use: Yes    Comment: occasional   Drug use: No    No Known Allergies  Current Outpatient Medications  Medication Sig Dispense Refill   docusate sodium (COLACE) 100 MG capsule Take  1 capsule (100 mg total) by mouth 2 (two) times daily. 30 capsule 0   HYDROcodone-acetaminophen (NORCO) 5-325 MG tablet Take 1-2 tablets by mouth every 6 (six) hours as needed for moderate pain or severe pain. 30 tablet 0   HYDROcodone-acetaminophen (NORCO/VICODIN) 5-325 MG tablet Take 1-2 tablets by mouth every 4 (four) hours as needed for moderate pain. 30 tablet 0   promethazine (PHENERGAN) 12.5 MG tablet Take 1 tablet (12.5 mg total) by mouth every 4 (four) hours as needed for nausea or  vomiting. 30 tablet 0   No current facility-administered medications for this visit.    REVIEW OF SYSTEMS:   Review of Systems  Constitutional: Negative for appetite change, chills, fatigue, fever and unexpected weight change.  HENT:   Negative for mouth sores, nosebleeds, sore throat and trouble swallowing.   Eyes: Negative for eye problems and icterus.  Respiratory: Negative for cough, hemoptysis, shortness of breath and wheezing.   Cardiovascular: Negative for chest pain and leg swelling.  Gastrointestinal: Negative for abdominal pain, constipation, diarrhea, nausea and vomiting.  Genitourinary: Negative for bladder incontinence, difficulty urinating, dysuria, frequency and hematuria.   Musculoskeletal: Negative for back pain, gait problem, neck pain and neck stiffness.  Skin: Negative for itching and rash.  Neurological: Negative for dizziness, extremity weakness, gait problem, headaches, light-headedness and seizures.  Hematological: Negative for adenopathy. Does not bruise/bleed easily.  Psychiatric/Behavioral: Negative for confusion, depression and sleep disturbance. The patient is not nervous/anxious.     PHYSICAL EXAMINATION:  There were no vitals taken for this visit.  ECOG PERFORMANCE STATUS: {CHL ONC ECOG [id number]  Physical Exam  Constitutional: Oriented to person, place, and time and well-developed, well-nourished, and in no distress. No distress.  HENT:  Head: Normocephalic and atraumatic.  Mouth/Throat: Oropharynx is clear and moist. No oropharyngeal exudate.  Eyes: Conjunctivae are normal. Right eye exhibits no discharge. Left eye exhibits no discharge. No scleral icterus.  Neck: Normal range of motion. Neck supple.  Cardiovascular: Normal rate, regular rhythm, normal heart sounds and intact distal pulses.   Pulmonary/Chest: Effort normal and breath sounds normal. No respiratory distress. No wheezes. No rales.  Abdominal: Soft. Bowel sounds are normal.  Exhibits no distension and no mass. There is no tenderness.  Musculoskeletal: Normal range of motion. Exhibits no edema.  Lymphadenopathy:    No cervical adenopathy.  Neurological: Alert and oriented to person, place, and time. Exhibits normal muscle tone. Gait normal. Coordination normal.  Skin: Skin is warm and dry. No rash noted. Not diaphoretic. No erythema. No pallor.  Psychiatric: Mood, memory and judgment normal.  Vitals reviewed.  LABORATORY DATA: Lab Results  Component Value Date   WBC 13.9 (H) 05/16/2017   HGB 14.2 05/17/2017   HCT 43.2 05/17/2017   MCV 86.4 05/16/2017   PLT 226 05/16/2017      Chemistry      Component Value Date/Time   NA 137 05/17/2017 0434   K 4.0 05/17/2017 0434   CL 101 05/17/2017 0434   CO2 27 05/17/2017 0434   BUN 12 05/17/2017 0434   CREATININE 1.44 (H) 05/17/2017 0434      Component Value Date/Time   CALCIUM 8.6 (L) 05/17/2017 0434   ALKPHOS 44 04/21/2017 0650   AST 20 04/21/2017 0650   ALT 19 04/21/2017 0650   BILITOT 0.7 04/21/2017 0650       RADIOGRAPHIC STUDIES: No results found.  ASSESSMENT: This is a very pleasant 57 year old Caucasian male with a history of stage T3a N0, M0 cell  carcinoma status post left radical nephrectomy on 05/16/2017 under the care of Dr. Lovena Neighbours.  The patient had been on observation since that time and feeling fine until he had a restaging surveillance CT scan on 09/02/2022 that showed enlarged mediastinal lymphadenopathy.  The patient was seen with Dr. Julien Nordmann today.  Dr. Julien Nordmann had a lengthy discussion with the patient today about his current condition and recommended treatment options.  Dr. Julien Nordmann recommends a PET scan to complete the staging workup.  Biopsy?  If this is positive for metastatic renal cell carcinoma, then Dr. Julien Nordmann would recommend ***radiation and immunotherapy?  Follow-up  I will arrange for    The patient voices understanding of current disease status and treatment options  and is in agreement with the current care plan.  All questions were answered. The patient knows to call the clinic with any problems, questions or concerns. We can certainly see the patient much sooner if necessary.  Thank you so much for allowing me to participate in the care of Devon Johnson. I will continue to follow up the patient with you and assist in his care.  I spent {CHL ONC TIME VISIT - [id number] counseling the patient face to face. The total time spent in the appointment was {CHL ONC TIME VISIT - [id number].  Disclaimer: This note was dictated with voice recognition software. Similar sounding words can inadvertently be transcribed and may not be corrected upon review.   [place] Austad L Calandria Mullings September 21, 2022, 1:22 PM

## 2022-09-22 ENCOUNTER — Inpatient Hospital Stay: Payer: Self-pay | Attending: Physician Assistant | Admitting: Physician Assistant

## 2022-09-22 ENCOUNTER — Inpatient Hospital Stay: Payer: Self-pay

## 2022-09-22 ENCOUNTER — Telehealth: Payer: Self-pay | Admitting: Physician Assistant

## 2022-09-22 ENCOUNTER — Other Ambulatory Visit: Payer: Self-pay | Admitting: Physician Assistant

## 2022-09-22 DIAGNOSIS — C642 Malignant neoplasm of left kidney, except renal pelvis: Secondary | ICD-10-CM

## 2022-09-22 NOTE — Telephone Encounter (Signed)
Error/ Duplicate

## 2022-09-22 NOTE — Telephone Encounter (Signed)
He missed his appointment today. I called him. He states that he called and spoke to someone about cancelling his appointment.  Him and his wife are going to look at his calendar and call us back about rescheduling.  He is not interested in rescheduling at this time.  He states he has our callback number. I have reached out to the new patient coordinator to make sure we monitor to make sure he gets rescheduled.

## 2022-09-29 ENCOUNTER — Other Ambulatory Visit: Payer: Self-pay | Admitting: Medical Oncology

## 2022-09-29 ENCOUNTER — Encounter (HOSPITAL_COMMUNITY): Payer: Self-pay | Admitting: Medical Oncology

## 2022-09-29 ENCOUNTER — Other Ambulatory Visit (HOSPITAL_COMMUNITY): Payer: Self-pay | Admitting: Internal Medicine

## 2022-09-29 DIAGNOSIS — C642 Malignant neoplasm of left kidney, except renal pelvis: Secondary | ICD-10-CM

## 2022-09-29 DIAGNOSIS — R590 Localized enlarged lymph nodes: Secondary | ICD-10-CM

## 2022-10-13 ENCOUNTER — Other Ambulatory Visit: Payer: Self-pay | Admitting: Medical Oncology

## 2022-10-13 DIAGNOSIS — R590 Localized enlarged lymph nodes: Secondary | ICD-10-CM

## 2022-10-13 DIAGNOSIS — C642 Malignant neoplasm of left kidney, except renal pelvis: Secondary | ICD-10-CM

## 2022-10-17 ENCOUNTER — Ambulatory Visit (HOSPITAL_COMMUNITY)
Admission: RE | Admit: 2022-10-17 | Discharge: 2022-10-17 | Disposition: A | Discharge status: Home / Self Care | Payer: BC Managed Care – PPO | Source: Ambulatory Visit | Attending: Medical Oncology | Admitting: Medical Oncology

## 2022-10-17 ENCOUNTER — Encounter (HOSPITAL_COMMUNITY)
Admission: RE | Admit: 2022-10-17 | Discharge: 2022-10-17 | Disposition: A | Discharge status: Home / Self Care | Payer: BC Managed Care – PPO | Source: Ambulatory Visit | Attending: Medical Oncology | Admitting: Medical Oncology

## 2022-10-17 DIAGNOSIS — C642 Malignant neoplasm of left kidney, except renal pelvis: Secondary | ICD-10-CM | POA: Diagnosis present

## 2022-10-17 DIAGNOSIS — R590 Localized enlarged lymph nodes: Secondary | ICD-10-CM | POA: Insufficient documentation

## 2022-10-17 LAB — GLUCOSE, CAPILLARY: Glucose-Capillary: 93 mg/dL (ref 70–99)

## 2022-10-17 MED ORDER — FLUDEOXYGLUCOSE F - 18 (FDG) INJECTION
13.5000 | Freq: Once | INTRAVENOUS | Status: AC
  Administered 2022-10-17: 13.4 via INTRAVENOUS

## 2022-10-17 MED ORDER — IOHEXOL 300 MG/ML  SOLN
100.0000 mL | Freq: Once | INTRAMUSCULAR | Status: AC | PRN
  Administered 2022-10-17: 100 mL via INTRAVENOUS

## 2022-10-18 ENCOUNTER — Other Ambulatory Visit: Payer: Self-pay | Admitting: Medical Oncology

## 2022-10-18 DIAGNOSIS — R590 Localized enlarged lymph nodes: Secondary | ICD-10-CM

## 2022-10-18 DIAGNOSIS — C642 Malignant neoplasm of left kidney, except renal pelvis: Secondary | ICD-10-CM
